# Patient Record
Sex: Male | Born: 1990 | Race: White | Hispanic: No | Marital: Single | Smoking: Former smoker
Health system: Southern US, Community
[De-identification: ages and names within clinical notes are randomized; demographics above are authoritative.]

## PROBLEM LIST (undated history)

## (undated) DIAGNOSIS — J45909 Unspecified asthma, uncomplicated: Secondary | ICD-10-CM

## (undated) HISTORY — DX: Unspecified asthma, uncomplicated: J45.909

## (undated) HISTORY — PX: WISDOM TOOTH EXTRACTION: SHX21

---

## 2017-03-28 ENCOUNTER — Other Ambulatory Visit: Payer: Self-pay | Admitting: Family Medicine

## 2017-03-28 DIAGNOSIS — R1031 Right lower quadrant pain: Secondary | ICD-10-CM

## 2017-03-29 ENCOUNTER — Other Ambulatory Visit: Payer: Self-pay | Admitting: Family Medicine

## 2017-03-29 ENCOUNTER — Ambulatory Visit
Admission: RE | Admit: 2017-03-29 | Discharge: 2017-03-29 | Disposition: A | Discharge status: Home / Self Care | Payer: BLUE CROSS/BLUE SHIELD | Source: Ambulatory Visit | Attending: Family Medicine | Admitting: Family Medicine

## 2017-03-29 DIAGNOSIS — R1031 Right lower quadrant pain: Secondary | ICD-10-CM

## 2017-03-29 DIAGNOSIS — R1030 Lower abdominal pain, unspecified: Secondary | ICD-10-CM | POA: Insufficient documentation

## 2017-03-31 ENCOUNTER — Other Ambulatory Visit: Payer: Self-pay | Admitting: Family Medicine

## 2017-03-31 ENCOUNTER — Telehealth: Payer: Self-pay | Admitting: *Deleted

## 2017-03-31 DIAGNOSIS — I8290 Acute embolism and thrombosis of unspecified vein: Secondary | ICD-10-CM

## 2017-03-31 NOTE — Telephone Encounter (Signed)
Incoming call from [facility] regarding coordination of NP apts. 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 Pt has another u/s sch. Of scrotum tomorrow at 230pm. I personally spoke with Dr. Georgann Housekeeper recommended pt to be seen in [facility] clinic on Tuesday afternoon.  Zella Ball made aware and will schedule this apt for the patient.

## 2017-04-01 ENCOUNTER — Ambulatory Visit
Admission: RE | Admit: 2017-04-01 | Discharge: 2017-04-01 | Disposition: A | Discharge status: Home / Self Care | Payer: BLUE CROSS/BLUE SHIELD | Source: Ambulatory Visit | Attending: Family Medicine | Admitting: Family Medicine

## 2017-04-01 DIAGNOSIS — I8290 Acute embolism and thrombosis of unspecified vein: Secondary | ICD-10-CM | POA: Diagnosis present

## 2017-04-01 DIAGNOSIS — N442 Benign cyst of testis: Secondary | ICD-10-CM | POA: Insufficient documentation

## 2017-04-01 DIAGNOSIS — N433 Hydrocele, unspecified: Secondary | ICD-10-CM | POA: Insufficient documentation

## 2017-04-01 DIAGNOSIS — I861 Scrotal varices: Secondary | ICD-10-CM | POA: Insufficient documentation

## 2017-04-05 ENCOUNTER — Encounter: Payer: Self-pay | Admitting: Internal Medicine

## 2017-04-05 ENCOUNTER — Other Ambulatory Visit: Payer: Self-pay

## 2017-04-05 ENCOUNTER — Inpatient Hospital Stay: Payer: BLUE CROSS/BLUE SHIELD | Attending: Internal Medicine | Admitting: Internal Medicine

## 2017-04-05 ENCOUNTER — Inpatient Hospital Stay: Payer: BLUE CROSS/BLUE SHIELD

## 2017-04-05 DIAGNOSIS — D6859 Other primary thrombophilia: Secondary | ICD-10-CM | POA: Insufficient documentation

## 2017-04-05 DIAGNOSIS — Z87891 Personal history of nicotine dependence: Secondary | ICD-10-CM | POA: Diagnosis not present

## 2017-04-05 DIAGNOSIS — Z7901 Long term (current) use of anticoagulants: Secondary | ICD-10-CM | POA: Insufficient documentation

## 2017-04-05 DIAGNOSIS — N433 Hydrocele, unspecified: Secondary | ICD-10-CM | POA: Insufficient documentation

## 2017-04-05 DIAGNOSIS — I749 Embolism and thrombosis of unspecified artery: Secondary | ICD-10-CM | POA: Diagnosis not present

## 2017-04-05 DIAGNOSIS — J45909 Unspecified asthma, uncomplicated: Secondary | ICD-10-CM | POA: Diagnosis not present

## 2017-04-05 DIAGNOSIS — I861 Scrotal varices: Secondary | ICD-10-CM | POA: Diagnosis not present

## 2017-04-05 DIAGNOSIS — L729 Follicular cyst of the skin and subcutaneous tissue, unspecified: Secondary | ICD-10-CM

## 2017-04-05 LAB — CBC WITH DIFFERENTIAL/PLATELET
BASOS ABS: 0.2 10*3/uL — AB (ref 0–0.1)
BASOS PCT: 2 %
EOS ABS: 0.2 10*3/uL (ref 0–0.7)
Eosinophils Relative: 3 %
HCT: 46.7 % (ref 40.0–52.0)
HEMOGLOBIN: 16.2 g/dL (ref 13.0–18.0)
Lymphocytes Relative: 27 %
Lymphs Abs: 1.8 10*3/uL (ref 1.0–3.6)
MCH: 29.5 pg (ref 26.0–34.0)
MCHC: 34.7 g/dL (ref 32.0–36.0)
MCV: 84.8 fL (ref 80.0–100.0)
MONOS PCT: 8 %
Monocytes Absolute: 0.5 10*3/uL (ref 0.2–1.0)
NEUTROS PCT: 60 %
Neutro Abs: 3.8 10*3/uL (ref 1.4–6.5)
Platelets: 248 10*3/uL (ref 150–440)
RBC: 5.51 MIL/uL (ref 4.40–5.90)
RDW: 12.7 % (ref 11.5–14.5)
WBC: 6.5 10*3/uL (ref 3.8–10.6)

## 2017-04-05 LAB — COMPREHENSIVE METABOLIC PANEL
ALBUMIN: 5.2 g/dL — AB (ref 3.5–5.0)
ALT: 12 U/L — AB (ref 17–63)
AST: 21 U/L (ref 15–41)
Alkaline Phosphatase: 61 U/L (ref 38–126)
Anion gap: 9 (ref 5–15)
BILIRUBIN TOTAL: 0.7 mg/dL (ref 0.3–1.2)
BUN: 13 mg/dL (ref 6–20)
CHLORIDE: 103 mmol/L (ref 101–111)
CO2: 27 mmol/L (ref 22–32)
CREATININE: 0.8 mg/dL (ref 0.61–1.24)
Calcium: 9.2 mg/dL (ref 8.9–10.3)
GFR calc Af Amer: >60 mL/min (ref >60)
GLUCOSE: 108 mg/dL — AB (ref 65–99)
Potassium: 3.8 mmol/L (ref 3.5–5.1)
Sodium: 139 mmol/L (ref 135–145)
TOTAL PROTEIN: 8.2 g/dL — AB (ref 6.5–8.1)

## 2017-04-05 NOTE — Progress Notes (Signed)
Patient is here today for a new patient visit.  

## 2017-04-05 NOTE — Progress Notes (Signed)
[facility] CONSULT NOTE  Patient Care Team: [place], [facility] as PCP - General  CHIEF COMPLAINTS/PURPOSE OF CONSULTATION: Blood clot.   #   $ October 11th 2018- Xarelto 15 BID.   No history exists.     HISTORY OF PRESENTING ILLNESS:  Ryan Ross 26 y.o.  male with no significant medical problems has been referred to [place] for further evaluation of "a clot"- in the right inguinal region.  Patient states that he initially noted to have pain in the right groin area approximately 1 week ago. This was evaluated further by an ultrasound- suggestive of superficial thrombophlebitis/clot. Given the symptomatology- patient has been started on Xarelto. Patient is currently on 15 twice a day. Since starting the Xarelto patient feels much improved.  He denies any prior history of blood clots. No family history of blood clots. Denies any history of smoking. He is very active. No long car rides or immobility. He works full-time as a Arts administrator. Patient denies any weight loss. Denies any nausea vomiting of blood loss.  ROS: A complete 10 point review of system is done which is negative except mentioned above in history of present illness  MEDICAL HISTORY:  Past Medical History:  Diagnosis Date  . Asthma     SURGICAL HISTORY: No past surgical history on file.  SOCIAL HISTORY:  Social History  . Marital status: Single    Spouse name: N/A  . Number of children: N/A  . Years of education: N/A   Occupational History  . Not on file.   Social History Main Topics  . Smoking status: Former Games developer  . Smokeless tobacco: Current User  . Alcohol use Yes     Comment: "social"  . Drug use: No  . Sexual activity: Not on file   Other Topics Concern  . Not on file   Social History Narrative  . No narrative on file    FAMILY HISTORY: No family history on file.  ALLERGIES:  has No Known Allergies.  MEDICATIONS:  Current Outpatient Prescriptions   Medication Sig Dispense Refill  . Rivaroxaban (XARELTO) 15 MG TABS tablet Take 15 mg by mouth 2 (two) times daily with a meal.     No current facility-administered medications for this visit.       Marland Kitchen  PHYSICAL EXAMINATION: ECOG PERFORMANCE STATUS: 0 - Asymptomatic  Vitals:   04/05/17 1345  BP: (!) 148/94  Pulse: 93  Resp: 14   There were no vitals filed for this visit.  GENERAL: Well-nourished well-developed; Alert, no distress and comfortable.   With family.  EYES: no pallor or icterus OROPHARYNX: no thrush or ulceration; good dentition  NECK: supple, no masses felt LYMPH:  no palpable lymphadenopathy in the cervical, axillary or inguinal regions LUNGS: clear to auscultation and  No wheeze or crackles HEART/CVS: regular rate & rhythm and no murmurs; No lower extremity edema ABDOMEN: abdomen soft, non-tender and normal bowel sounds Musculoskeletal:no cyanosis of digits and no clubbing  PSYCH: alert & oriented x 3 with fluent speech NEURO: no focal motor/sensory deficits SKIN:  no rashes or significant lesions  LABORATORY DATA:  I have reviewed the data as listed Lab Results  Component Value Date   WBC 6.5 04/05/2017   HGB 16.2 04/05/2017   HCT 46.7 04/05/2017   MCV 84.8 04/05/2017   PLT 248 04/05/2017    Recent Labs  04/05/17 1420  NA 139  K 3.8  CL 103  CO2 27  GLUCOSE 108*  BUN 13  CREATININE 0.80  CALCIUM 9.2  GFRNONAA >60  GFRAA >60  PROT 8.2*  ALBUMIN 5.2*  AST 21  ALT 12*  ALKPHOS 61  BILITOT 0.7    RADIOGRAPHIC STUDIES: I have personally reviewed the radiological images as listed and agreed with the findings in the report. [place] Rt Lower Extrem Ltd Soft Tissue Non Vascular  Result Date: 03/30/2017 CLINICAL DATA:  Right inguinal pain and "Rope like area" for 4 days. EXAM: ULTRASOUND LIMITED SOFT TISSUE TECHNIQUE: Ultrasound examination of the lower extremity soft tissues was performed in the area of clinical concern. COMPARISON:  None.  FINDINGS: Focused ultrasound exam was performed in the area of patient concern which is in the right inguinal region. The sonographer identifies a tubular hypoechoic structure with sonographic features compatible with thrombosed vessel. No hernia sac could be identified in the right groin region. IMPRESSION: 1. Ultrasound features most suggestive of vascular thrombosis in the right inguinal region, the area of patient concern. This is likely related to superficial venous thrombosis. Given proximity to the inguinal canal, gonadal venous thrombosis would be a consideration and scrotal ultrasound may prove helpful to assess for varicocele thrombosis although this typically presents with scrotal pain . Electronically Signed   By: Kennith Center M.D.   On: 03/30/2017 14:34   [place] Scrotom W/doppler  Result Date: 04/01/2017 CLINICAL DATA:  Evaluate for varicocele thrombosis. EXAM: SCROTAL ULTRASOUND DOPPLER ULTRASOUND OF THE TESTICLES TECHNIQUE: Complete ultrasound examination of the testicles, epididymis, and other scrotal structures was performed. Color and spectral Doppler ultrasound were also utilized to evaluate blood flow to the testicles. COMPARISON:  Ultrasound 03/29/2017. FINDINGS: Right testicle Measurements: 4.2 x 2.0 x 2.5 cm. No mass or microlithiasis visualized. Left testicle Measurements: 3.8 x 2.0 x 2.6 cm. No microlithiasis visualized.2 mm septated small cyst noted. Follow-up exam suggested demonstrate stability. Right epididymis:  Normal in size and appearance. Left epididymis:  Normal in size and appearance. Hydrocele:  Small right hydrocele. Varicocele: Bilateral varicoceles. No definite varicocele thrombosis noted. Pulsed Doppler interrogation of both testes demonstrates normal low resistance arterial and venous waveforms bilaterally. IMPRESSION: 1. Bilateral varicoceles.  No definite varicocele thrombosis noted . 2. Tiny 2 mm septated cyst left testicle. Follow-up exam in 3 months suggested to  demonstrate stability . 3. Small right hydrocele . Electronically Signed   By: Maisie Fus  Register   On: 04/01/2017 15:15   IMPRESSION: 1. Ultrasound features most suggestive of vascular thrombosis in the right inguinal region, the area of patient concern. This is likely related to superficial venous thrombosis. Given proximity to the inguinal canal, gonadal venous thrombosis would be a consideration and scrotal ultrasound may prove helpful to assess for varicocele thrombosis although this typically presents with scrotal pain .   Electronically Signed   By: Kennith Center M.D.   On: 03/30/2017 14:34  ASSESSMENT & PLAN:   Primary hypercoagulable state (HCC) # Right inguinal superficial blood clot/thrombophlebitis. Scrotal ultrasound negative for any gonadal vein involvement. The etiology of superficial blood clot is unclear- given the symptomatology however it is reasonable to continue Xarelto for 3-4 weeks. Recommend hypercoagulable workup- prothrombin gene mutation factor V Leiden and anti-phospholipid antibodies workup.   # Incidental left scrotal 3 mm cyst- recommend follow-up in 3 months.  # The above plan of care was discussed the patient and family in detail. Patient follow-up with me in approximately 10 days or so to review of her workup/next plan of care.  Thank you Dr.Rabinowitz for allowing me to participate  in the care of your pleasant patient. Please do not hesitate to contact me with questions or concerns in the interim.  All questions were answered. The patient knows to call the clinic with any problems, questions or concerns.    Earna Coder, MD 04/07/2017 4:31 PM

## 2017-04-07 LAB — CARDIOLIPIN ANTIBODIES, IGG, IGM, IGA: Anticardiolipin IgA: <9 APL U/mL (ref 0–11)

## 2017-04-07 LAB — BETA-2-GLYCOPROTEIN I ABS, IGG/M/A: Beta-2 Glyco I IgG: <9 GPI IgG units (ref 0–20)

## 2017-04-07 NOTE — Assessment & Plan Note (Signed)
#   Right inguinal superficial blood clot/thrombophlebitis. Scrotal ultrasound negative for any gonadal vein involvement. The etiology of superficial blood clot is unclear- given the symptomatology however it is reasonable to continue Xarelto for 3-4 weeks. Recommend hypercoagulable workup- prothrombin gene mutation factor V Leiden and anti-phospholipid antibodies workup.   # Incidental left scrotal 3 mm cyst- recommend follow-up in 3 months.  # The above plan of care was discussed the patient and family in detail. Patient follow-up with me in approximately 10 days or so to review of her workup/next plan of care.  Thank you Dr.Rabinowitz for allowing me to participate in the care of your pleasant patient. Please do not hesitate to contact me with questions or concerns in the interim.

## 2017-04-11 LAB — PROTHROMBIN GENE MUTATION

## 2017-04-11 LAB — FACTOR 5 LEIDEN

## 2017-04-22 ENCOUNTER — Inpatient Hospital Stay: Payer: BLUE CROSS/BLUE SHIELD | Attending: Internal Medicine | Admitting: Internal Medicine

## 2017-04-22 VITALS — BP 146/71 | HR 88 | Temp 98.1°F | Resp 16 | Wt 134.2 lb

## 2017-04-22 DIAGNOSIS — J45909 Unspecified asthma, uncomplicated: Secondary | ICD-10-CM | POA: Insufficient documentation

## 2017-04-22 DIAGNOSIS — I82890 Acute embolism and thrombosis of other specified veins: Secondary | ICD-10-CM | POA: Insufficient documentation

## 2017-04-22 DIAGNOSIS — D6859 Other primary thrombophilia: Secondary | ICD-10-CM

## 2017-04-22 DIAGNOSIS — Z87891 Personal history of nicotine dependence: Secondary | ICD-10-CM | POA: Insufficient documentation

## 2017-04-22 DIAGNOSIS — Z7901 Long term (current) use of anticoagulants: Secondary | ICD-10-CM | POA: Insufficient documentation

## 2017-04-22 DIAGNOSIS — L729 Follicular cyst of the skin and subcutaneous tissue, unspecified: Secondary | ICD-10-CM | POA: Diagnosis not present

## 2017-04-22 NOTE — Assessment & Plan Note (Addendum)
#   Right inguinal superficial blood clot/thrombophlebitis- symptomatic- resolved s/p 3 weeks of xarelto. Recommend stopping xarelto at this time. Hypercoagulable workup- prothrombin gene mutation factor V Leiden and anti-phospholipid antibodies-NEGATIVE.   # Incidental left scrotal 3 mm cyst- recommend follow-up in 3 months US as per PCP.   # follow up with us as needed.   Cc; Dr.Rabinowitz

## 2017-04-22 NOTE — Progress Notes (Signed)
[facility] OFFICE PROGRESS NOTE  Patient Care Team: [place], [facility] as PCP - General  Cancer Staging No matching staging information was found for the patient.    No history exists.   #  October 11th 2018- Xarelto 15 BID     INTERVAL HISTORY:  Ryan Ross 26 y.o.  male pleasant patient above history of superficial venous thrombosis of the right inguinal region symptomatic currently on Xarelto is here for follow-up. Patient finished his 3 weeks of Xarelto this morning.  Patient admits to no new Clots. Denies any bleeding complications. No weight loss. No nausea no vomiting. He feels otherwise normal.  REVIEW OF SYSTEMS:  A complete 10 point review of system is done which is negative except mentioned above/history of present illness.   PAST MEDICAL HISTORY :  Past Medical History:  Diagnosis Date  . Asthma     PAST SURGICAL HISTORY :  No past surgical history on file.  FAMILY HISTORY :  No family history on file.  SOCIAL HISTORY:   Social History  Substance Use Topics  . Smoking status: Former Games developer  . Smokeless tobacco: Current User  . Alcohol use Yes     Comment: "social"    ALLERGIES:  has No Known Allergies.  MEDICATIONS:  Current Outpatient Prescriptions  Medication Sig Dispense Refill  . Rivaroxaban (XARELTO) 15 MG TABS tablet Take 15 mg by mouth 2 (two) times daily with a meal.     No current facility-administered medications for this visit.     PHYSICAL EXAMINATION: ECOG PERFORMANCE STATUS: 0 - Asymptomatic  BP (!) 146/71 (BP Location: Left Arm, Patient Position: Sitting)   Pulse 88   Temp 98.1 F (36.7 C) (Tympanic)   Resp 16   Wt 134 lb 3.2 oz (60.9 kg)   Filed Weights   04/22/17 0926  Weight: 134 lb 3.2 oz (60.9 kg)    GENERAL: Well-nourished well-developed; Alert, no distress and comfortable.   With family.  EYES: no pallor or icterus OROPHARYNX: no thrush or ulceration; good dentition  NECK: supple, no  masses felt LYMPH:  no palpable lymphadenopathy in the cervical, axillary or inguinal regions LUNGS: clear to auscultation and  No wheeze or crackles HEART/CVS: regular rate & rhythm and no murmurs; No lower extremity edema ABDOMEN:abdomen soft, non-tender and normal bowel sounds Musculoskeletal:no cyanosis of digits and no clubbing  PSYCH: alert & oriented x 3 with fluent speech NEURO: no focal motor/sensory deficits SKIN:  no rashes or significant lesions  LABORATORY DATA:  I have reviewed the data as listed    Component Value Date/Time   NA 139 04/05/2017 1420   K 3.8 04/05/2017 1420   CL 103 04/05/2017 1420   CO2 27 04/05/2017 1420   GLUCOSE 108 (H) 04/05/2017 1420   BUN 13 04/05/2017 1420   CREATININE 0.80 04/05/2017 1420   CALCIUM 9.2 04/05/2017 1420   PROT 8.2 (H) 04/05/2017 1420   ALBUMIN 5.2 (H) 04/05/2017 1420   AST 21 04/05/2017 1420   ALT 12 (L) 04/05/2017 1420   ALKPHOS 61 04/05/2017 1420   BILITOT 0.7 04/05/2017 1420   GFRNONAA >60 04/05/2017 1420   GFRAA >60 04/05/2017 1420    No results found for: SPEP, UPEP  Lab Results  Component Value Date   WBC 6.5 04/05/2017   NEUTROABS 3.8 04/05/2017   HGB 16.2 04/05/2017   HCT 46.7 04/05/2017   MCV 84.8 04/05/2017   PLT 248 04/05/2017      Chemistry  Component Value Date/Time   NA 139 04/05/2017 1420   K 3.8 04/05/2017 1420   CL 103 04/05/2017 1420   CO2 27 04/05/2017 1420   BUN 13 04/05/2017 1420   CREATININE 0.80 04/05/2017 1420      Component Value Date/Time   CALCIUM 9.2 04/05/2017 1420   ALKPHOS 61 04/05/2017 1420   AST 21 04/05/2017 1420   ALT 12 (L) 04/05/2017 1420   BILITOT 0.7 04/05/2017 1420       RADIOGRAPHIC STUDIES: I have personally reviewed the radiological images as listed and agreed with the findings in the report. No results found.   ASSESSMENT & PLAN:  Primary hypercoagulable state (HCC) # Right inguinal superficial blood clot/thrombophlebitis- symptomatic- resolved  s/p 3 weeks of xarelto. Recommend stopping xarelto at this time. Hypercoagulable workup- prothrombin gene mutation factor V Leiden and anti-phospholipid antibodies-NEGATIVE.   # Incidental left scrotal 3 mm cyst- recommend follow-up in 3 months US as per PCP.   # follow up with us as needed.   Cc; Dr.Rabinowitz     No orders of the defined types were placed in this encounter.  All questions were answered. The patient knows to call the clinic with any problems, questions or concerns.      Earna CoderGovinda R Brahmanday, MD 04/22/2017 3:25 PM

## 2017-06-29 ENCOUNTER — Other Ambulatory Visit: Payer: Self-pay | Admitting: Family Medicine

## 2017-06-29 DIAGNOSIS — N442 Benign cyst of testis: Secondary | ICD-10-CM

## 2017-07-21 ENCOUNTER — Ambulatory Visit
Admission: RE | Admit: 2017-07-21 | Discharge: 2017-07-21 | Disposition: A | Discharge status: Home / Self Care | Payer: BLUE CROSS/BLUE SHIELD | Source: Ambulatory Visit | Attending: Family Medicine | Admitting: Family Medicine

## 2017-07-21 DIAGNOSIS — N442 Benign cyst of testis: Secondary | ICD-10-CM | POA: Diagnosis present

## 2017-07-21 DIAGNOSIS — I861 Scrotal varices: Secondary | ICD-10-CM | POA: Diagnosis not present

## 2018-02-20 IMAGING — US US EXTREM LOW*R* LIMITED
1 series · 14 of 25 positions shown · non-contrast
Comparison: None.

CLINICAL DATA: Right inguinal pain and "Rope like area" for 4 days.

EXAM:
ULTRASOUND LIMITED SOFT TISSUE
TECHNIQUE: Ultrasound examination of the lower extremity soft tissues was
performed in the area of clinical concern.

[Series 1: us extrem low*right* limited · 0.07mm/px · 14 of 25 slices shown]
[im 1/25]
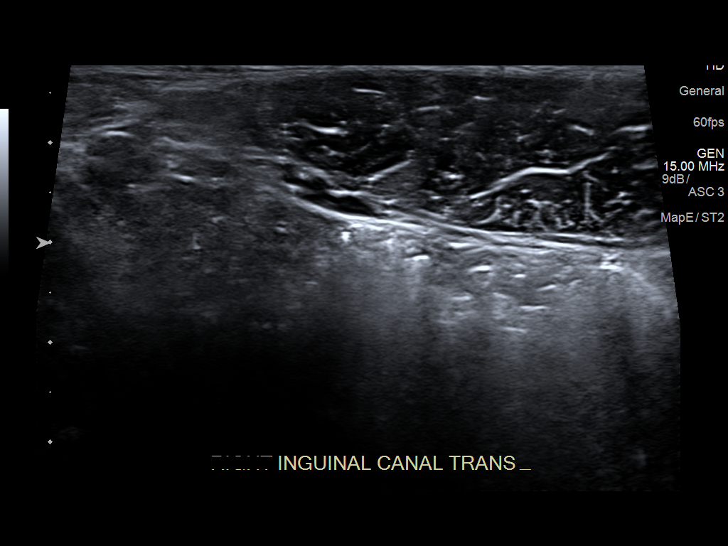
[im 3/25]
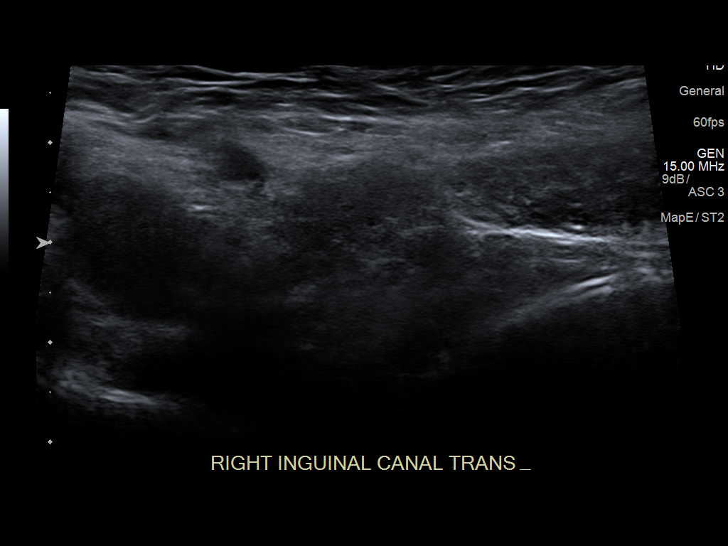
[im 5/25]
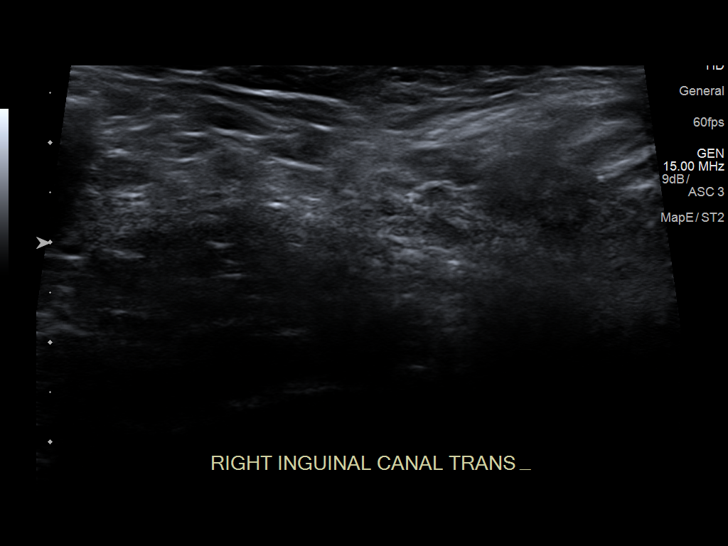
[im 7/25]
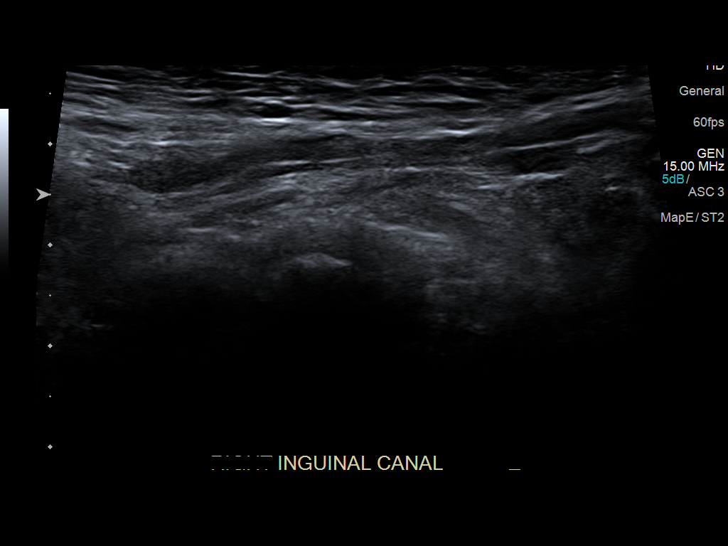
[im 9/25]
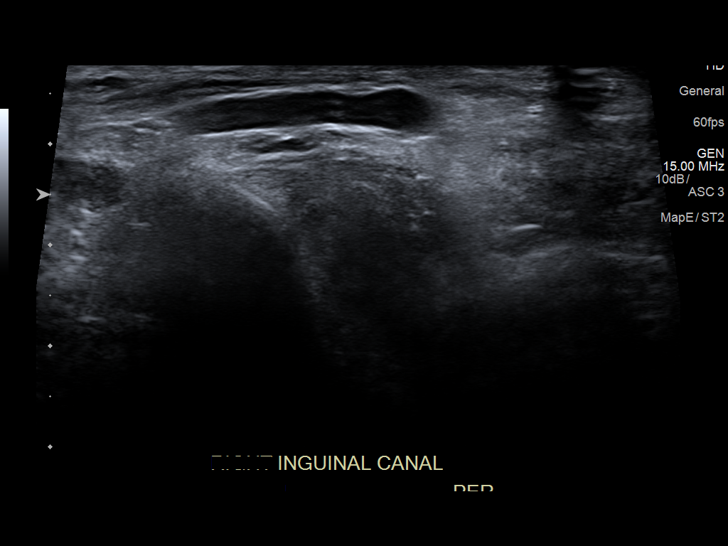
[im 10/25]
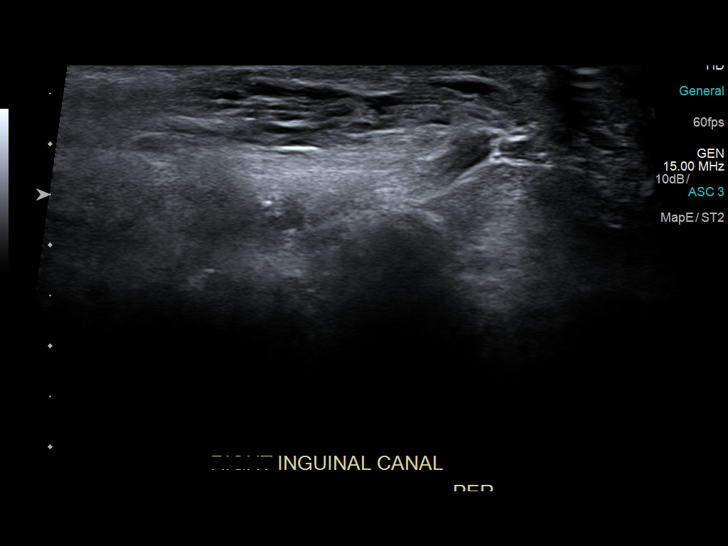
[im 12/25]
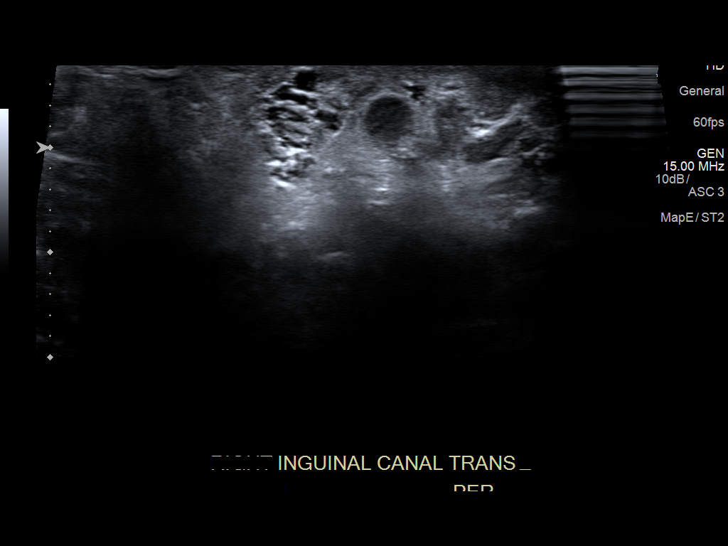
[im 14/25]
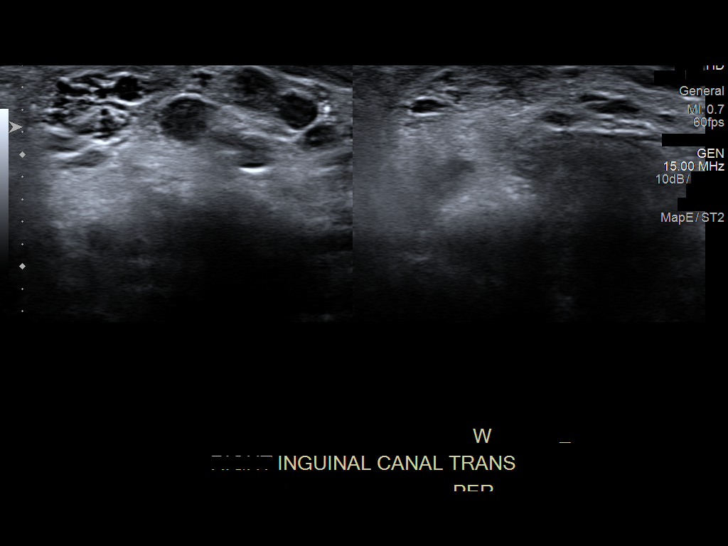
[im 16/25]
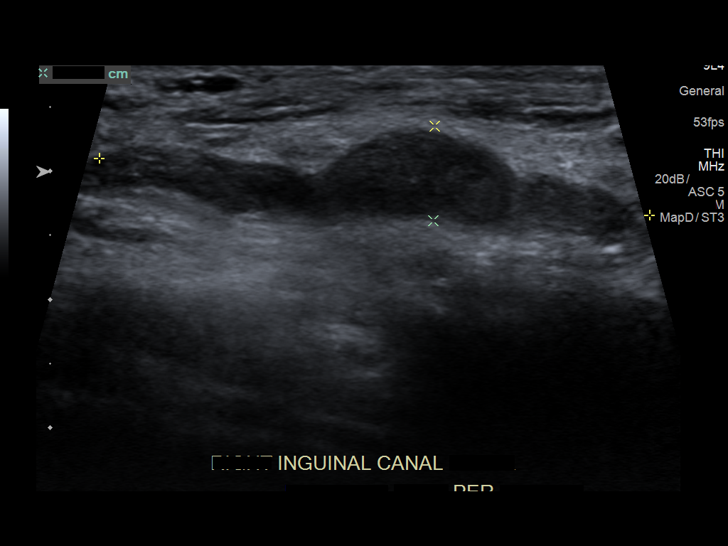
[im 17/25]
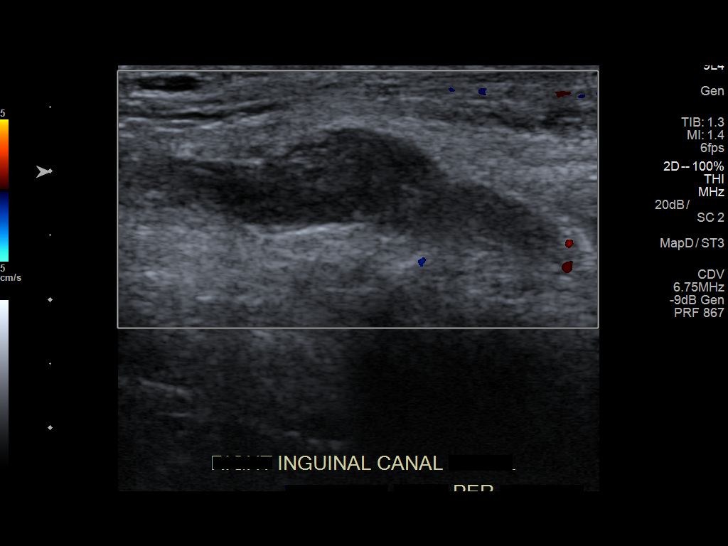
[im 19/25]
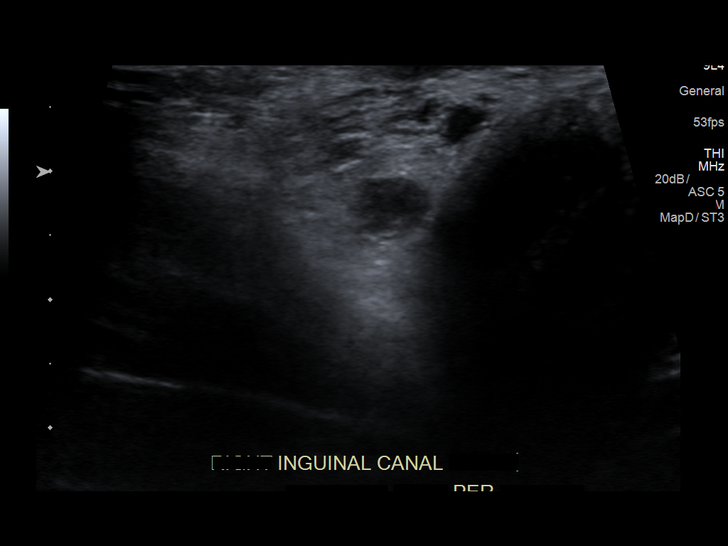
[im 21/25]
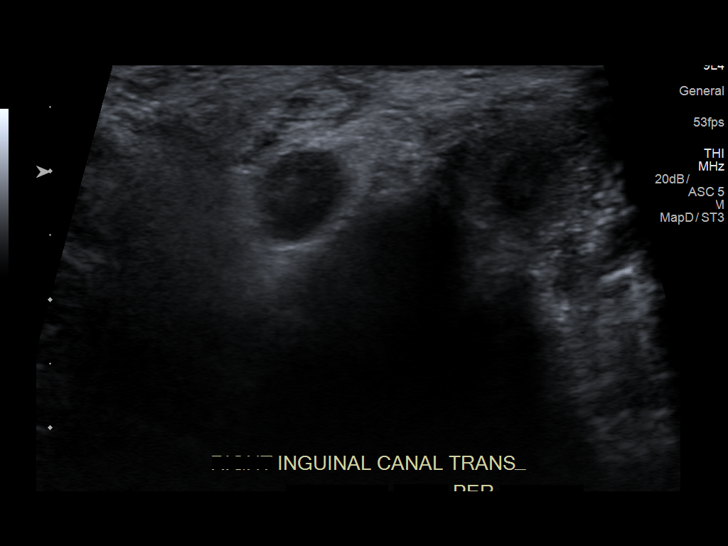
[im 23/25]
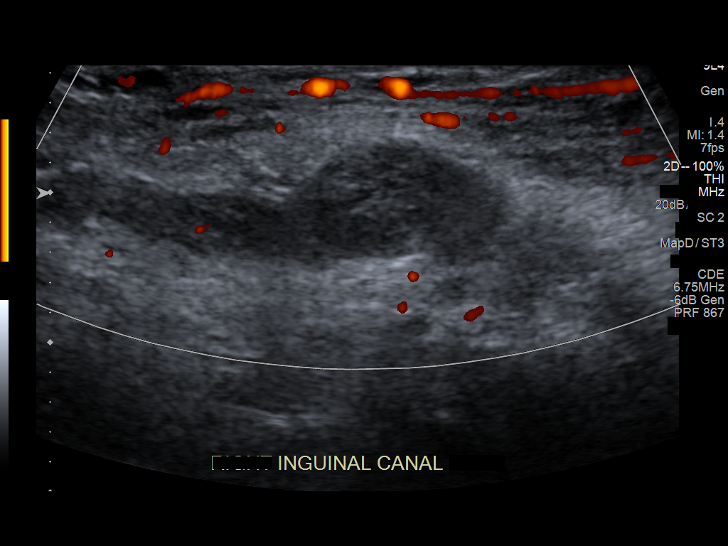
[im 25/25]
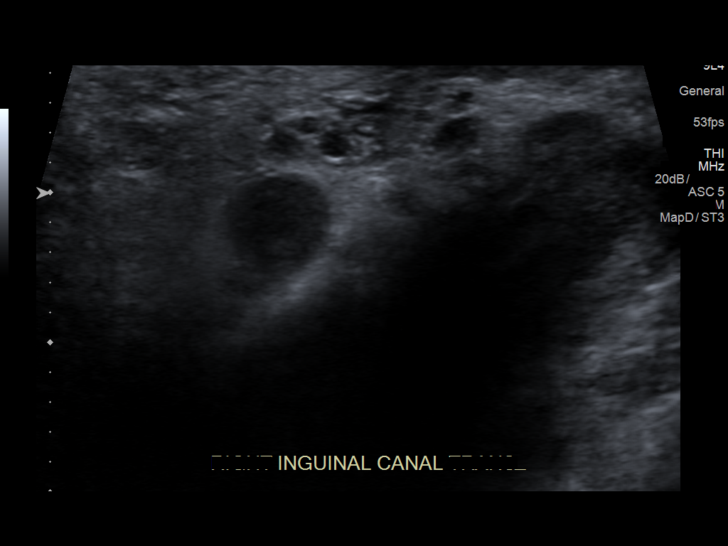

[14 of 25 positions shown; findings below may reference images not displayed]

FINDINGS: Focused ultrasound exam was performed in the area of patient concern
which is in the right inguinal region. The sonographer identifies a
tubular hypoechoic structure with sonographic features compatible
with thrombosed vessel.

No hernia sac could be identified in the right groin region.
IMPRESSION: 1. Ultrasound features most suggestive of vascular thrombosis in the
right inguinal region, the area of patient concern. This is likely
related to superficial venous thrombosis. Given proximity to the
inguinal canal, gonadal venous thrombosis would be a consideration
and scrotal ultrasound may prove helpful to assess for varicocele
thrombosis although this typically presents with scrotal pain .

## 2018-02-23 IMAGING — US US SCROTUM W/ DOPPLER COMPLETE
1 series · 13 of 25 positions shown · non-contrast
Comparison: Ultrasound 03/29/2017.

CLINICAL DATA: Evaluate for varicocele thrombosis.

EXAM:
SCROTAL ULTRASOUND
DOPPLER ULTRASOUND OF THE TESTICLES
TECHNIQUE: Complete ultrasound examination of the testicles, epididymis, and
other scrotal structures was performed. Color and spectral Doppler
ultrasound were also utilized to evaluate blood flow to the
testicles.

[Series 1: us scrotum w/ doppler complete · 0.07mm/px · 13 of 49 slices shown]
[im 1/49]
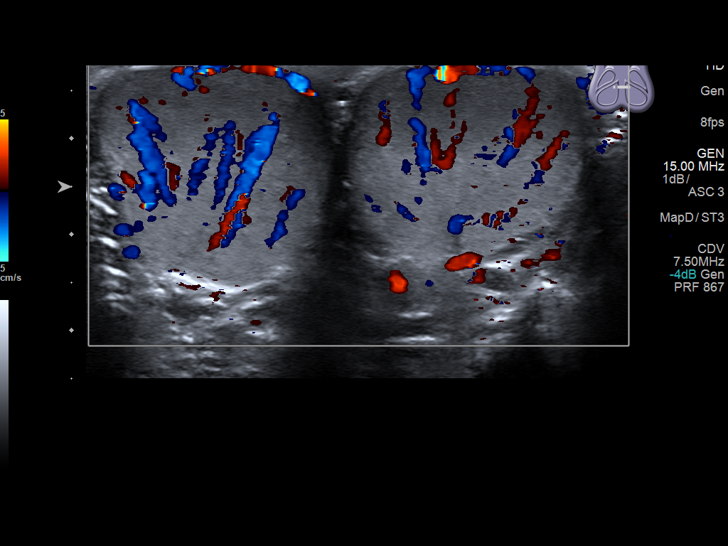
[im 5/49]
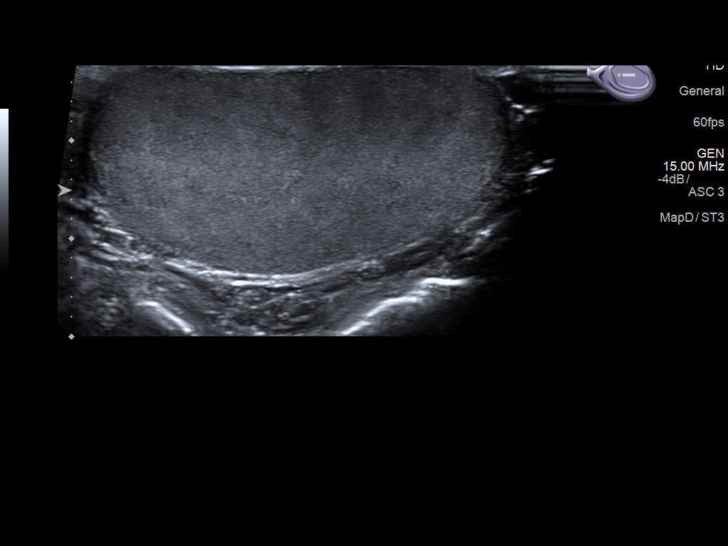
[im 9/49]
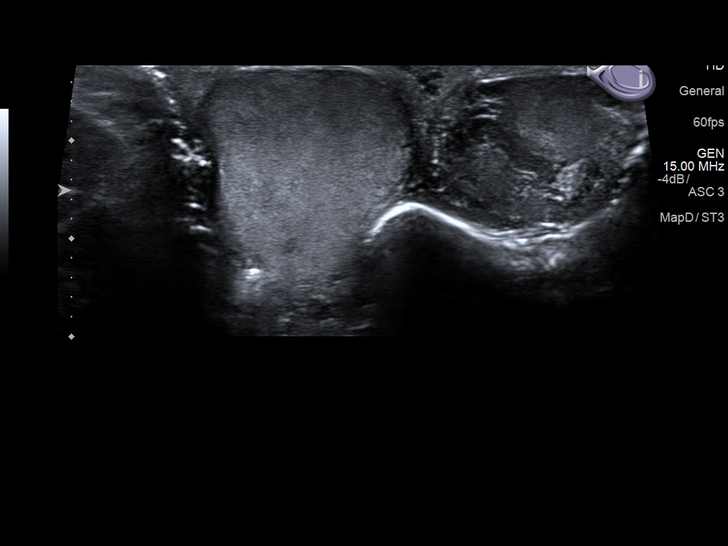
[im 13/49]
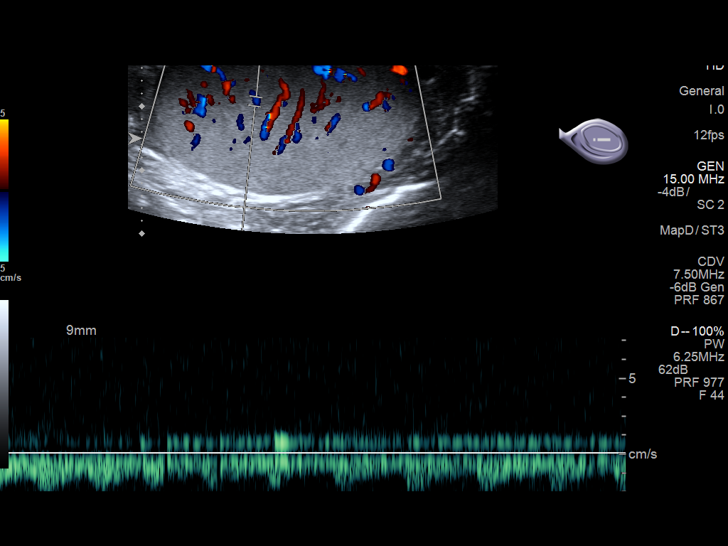
[im 17/49]
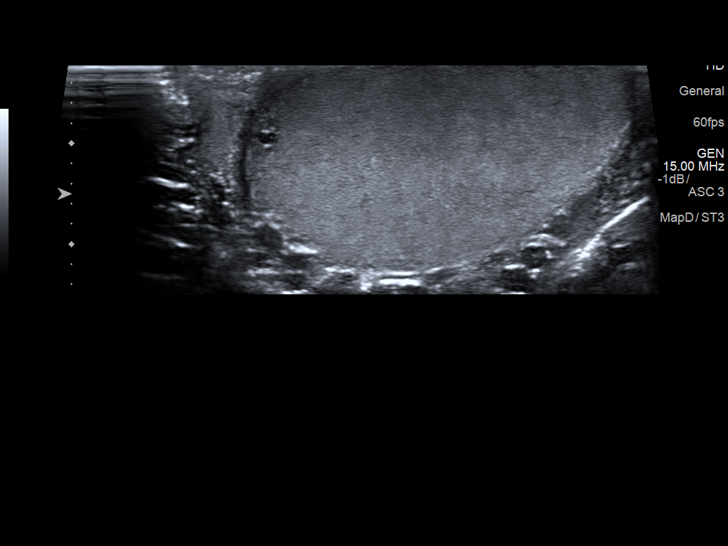
[im 21/49]
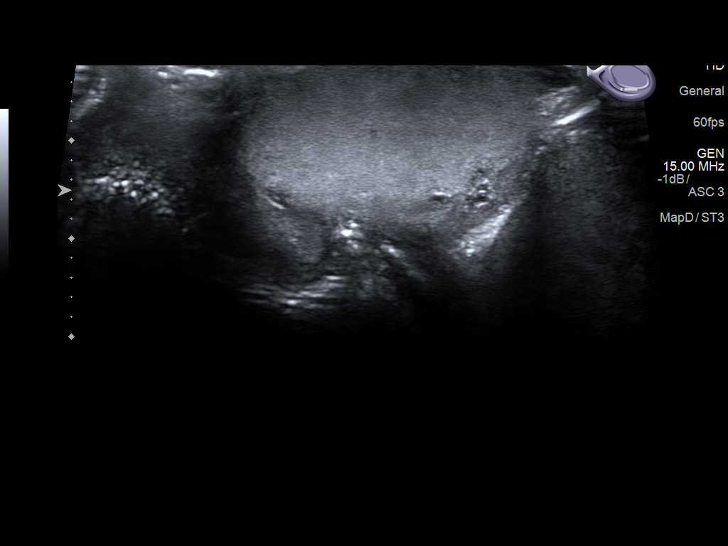
[im 25/49]
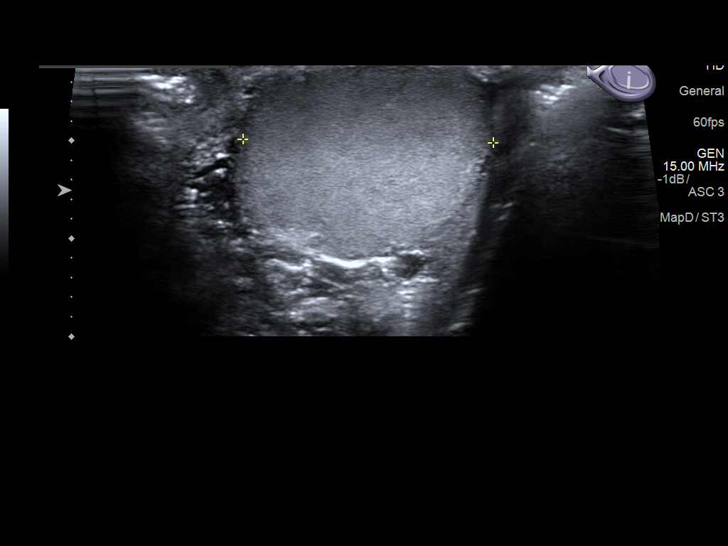
[im 29/49]
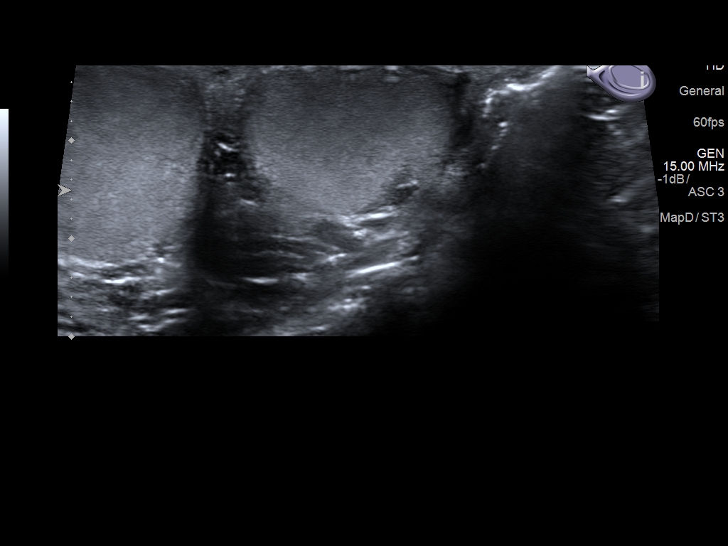
[im 33/49]
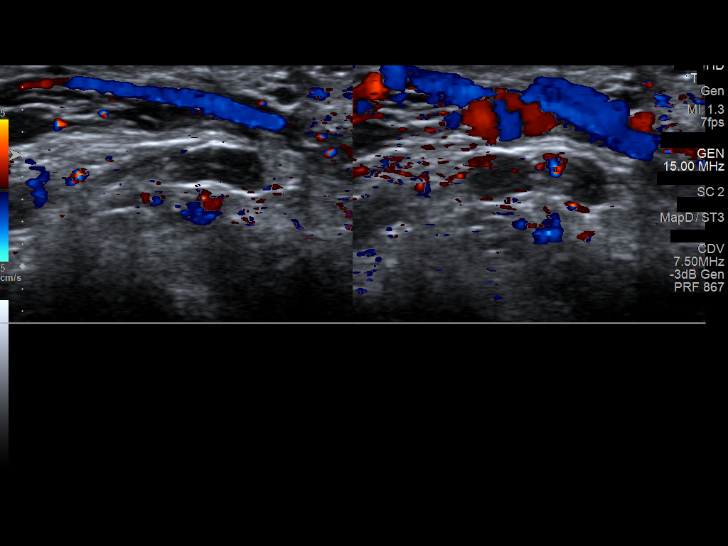
[im 37/49]
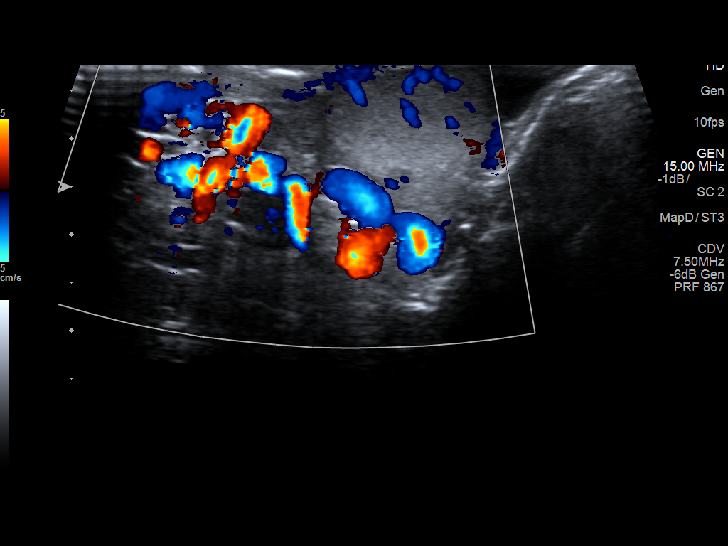
[im 41/49]
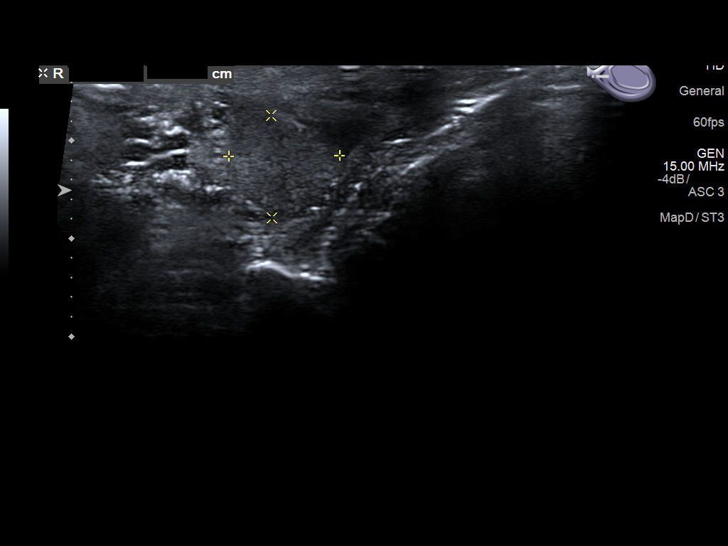
[im 45/49]
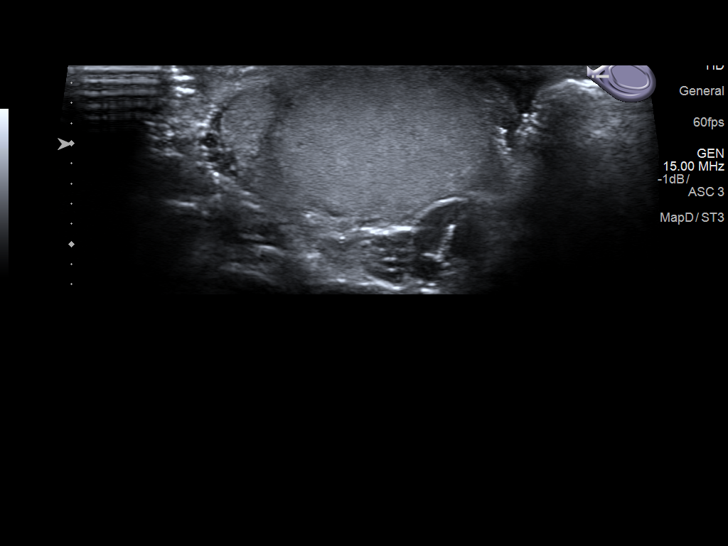
[im 49/49]
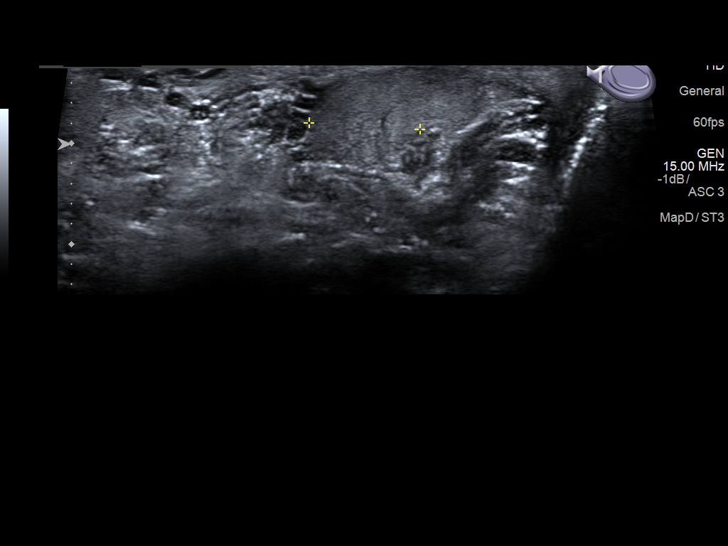

[13 of 25 positions shown; findings below may reference images not displayed]

FINDINGS: Right testicle

Measurements: 4.2 x 2.0 x 2.5 cm. No mass or microlithiasis
visualized.

Left testicle

Measurements: 3.8 x 2.0 x 2.6 cm. No microlithiasis visualized.2 mm
septated small cyst noted. Follow-up exam suggested demonstrate
stability.

Right epididymis:  Normal in size and appearance.

Left epididymis:  Normal in size and appearance.

Hydrocele:  Small right hydrocele.

Varicocele: Bilateral varicoceles. No definite varicocele thrombosis
noted.

Pulsed Doppler interrogation of both testes demonstrates normal low
resistance arterial and venous waveforms bilaterally.
IMPRESSION: 1. Bilateral varicoceles.  No definite varicocele thrombosis noted .

2. Tiny 2 mm septated cyst left testicle. Follow-up exam in 3 months
suggested to demonstrate stability .

3. Small right hydrocele .

## 2018-06-14 IMAGING — US US SCROTUM W/ DOPPLER COMPLETE
1 series · 14 of 25 positions shown · non-contrast
Comparison: 04/01/2017

CLINICAL DATA: Follow-up testicular cyst

EXAM:
SCROTAL ULTRASOUND
DOPPLER ULTRASOUND OF THE TESTICLES
TECHNIQUE: Complete ultrasound examination of the testicles, epididymis, and
other scrotal structures was performed. Color and spectral Doppler
ultrasound were also utilized to evaluate blood flow to the
testicles.

[Series 1: us scrotum w/ doppler complete · 0.07mm/px · 14 of 50 slices shown]
[im 1/50]
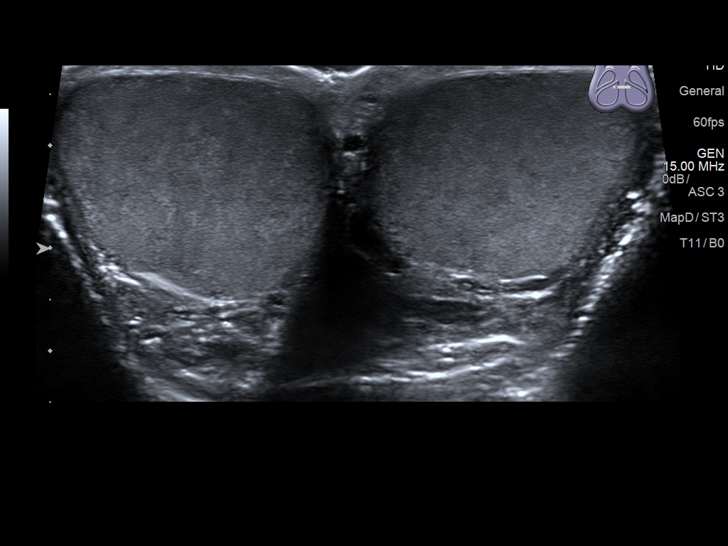
[im 5/50]
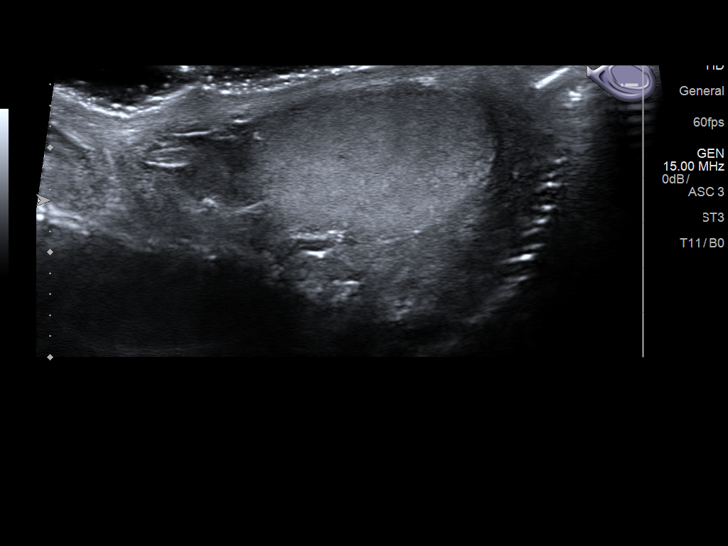
[im 9/50]
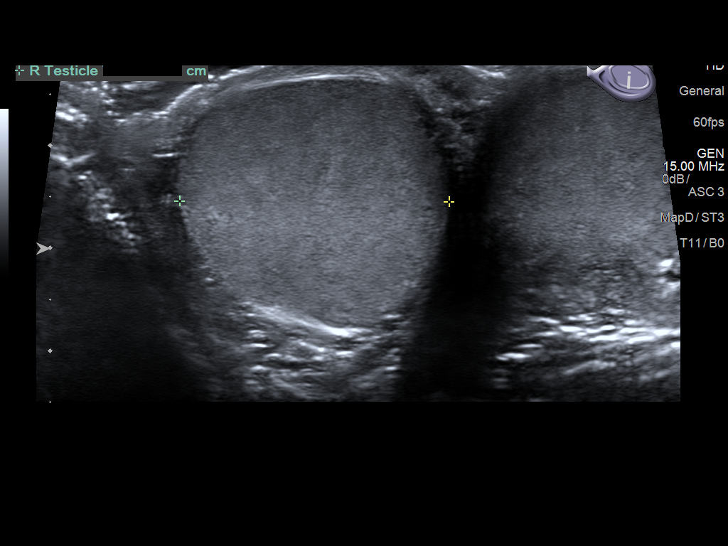
[im 13/50]
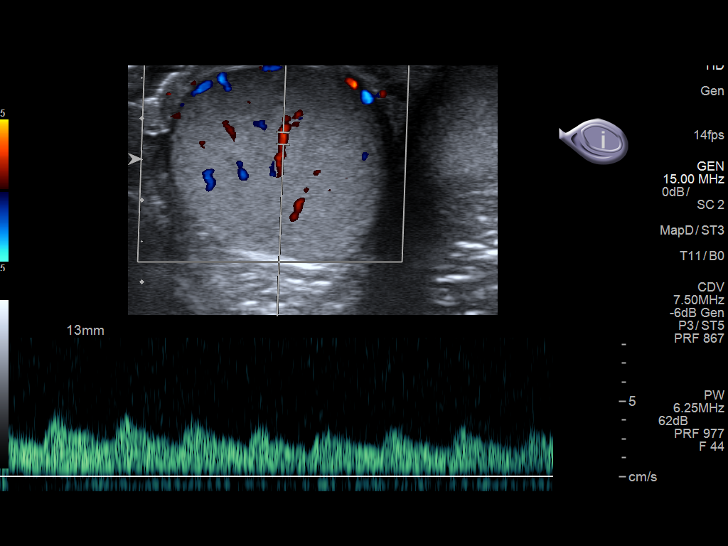
[im 17/50]
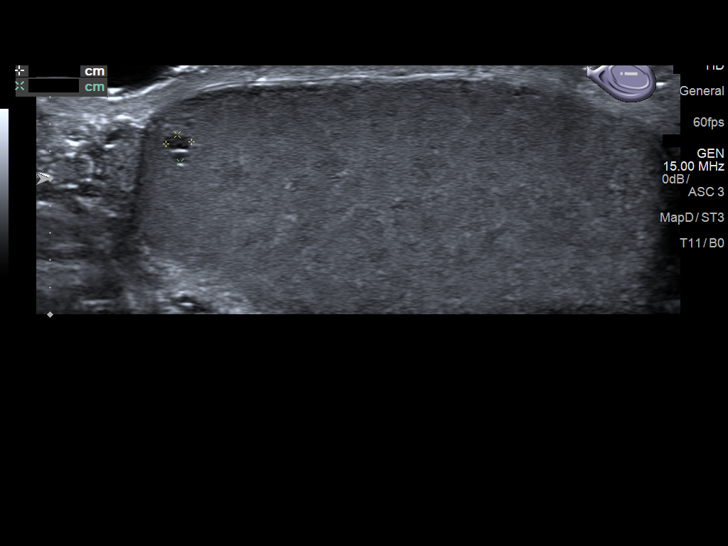
[im 19/50]
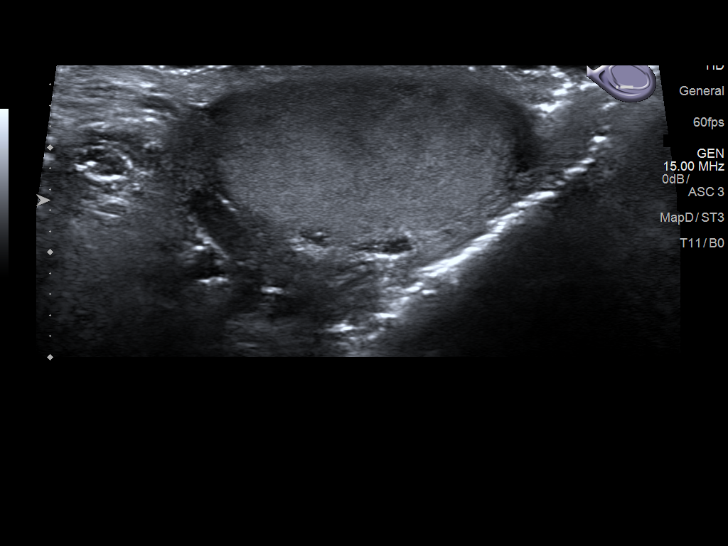
[im 23/50]
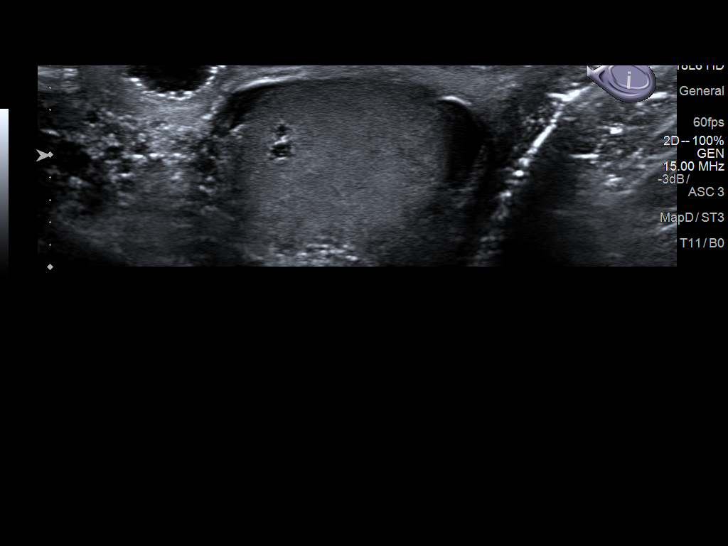
[im 27/50]
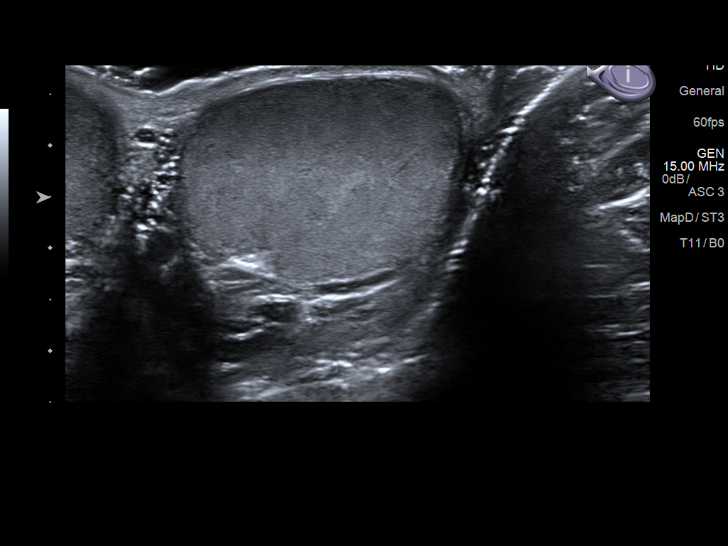
[im 31/50]
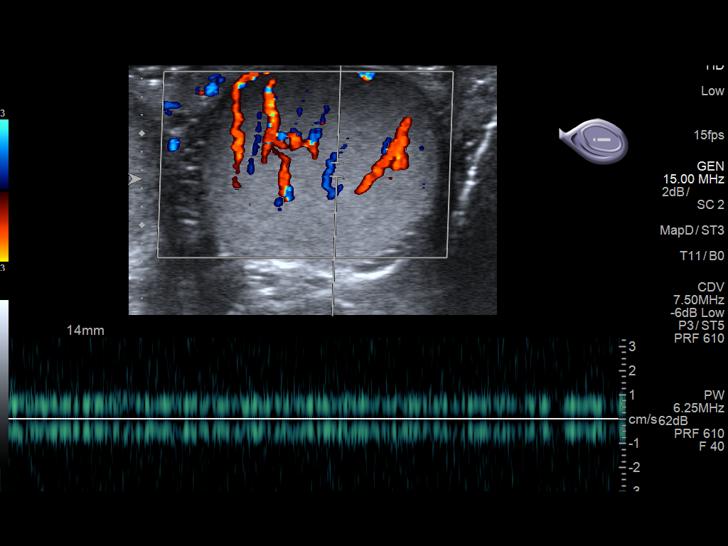
[im 33/50]
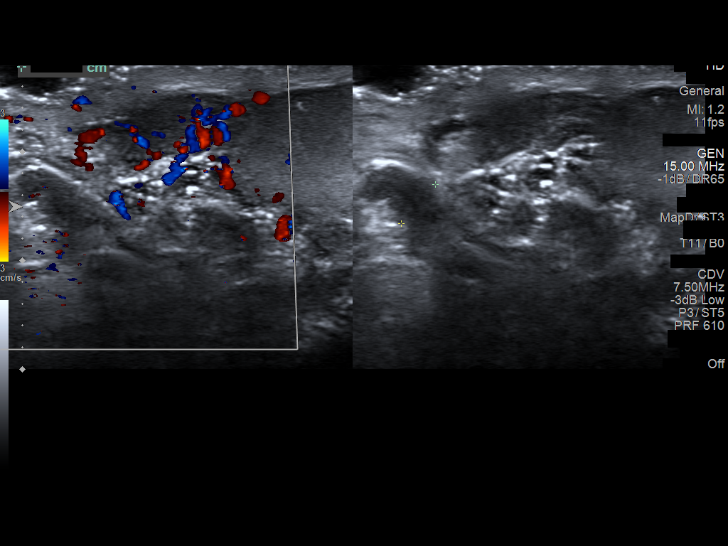
[im 37/50]
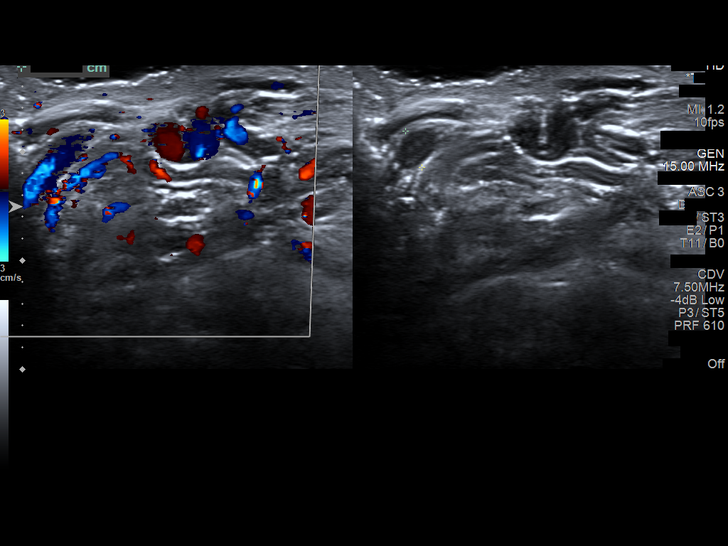
[im 41/50]
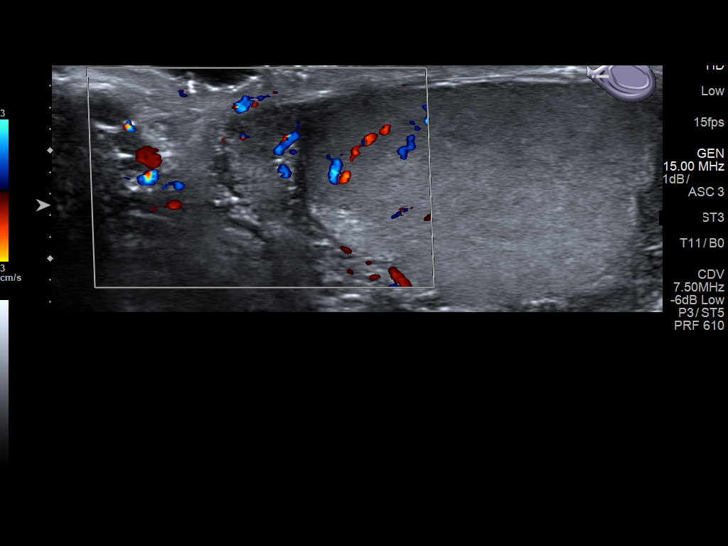
[im 45/50]
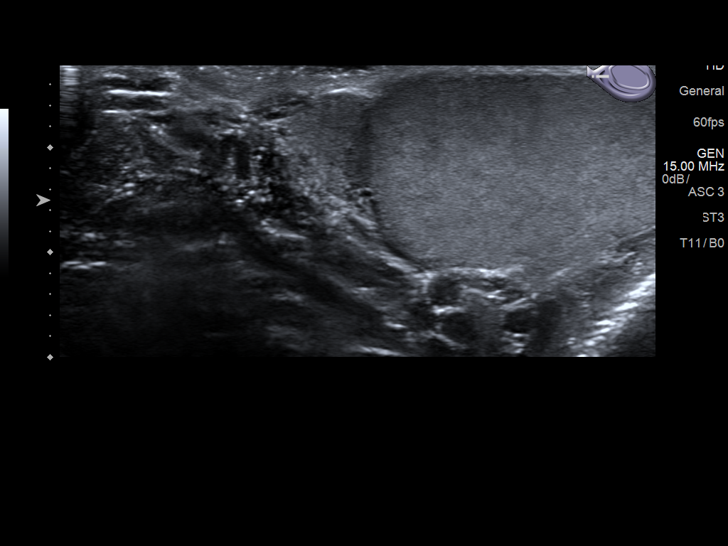
[im 50/50]
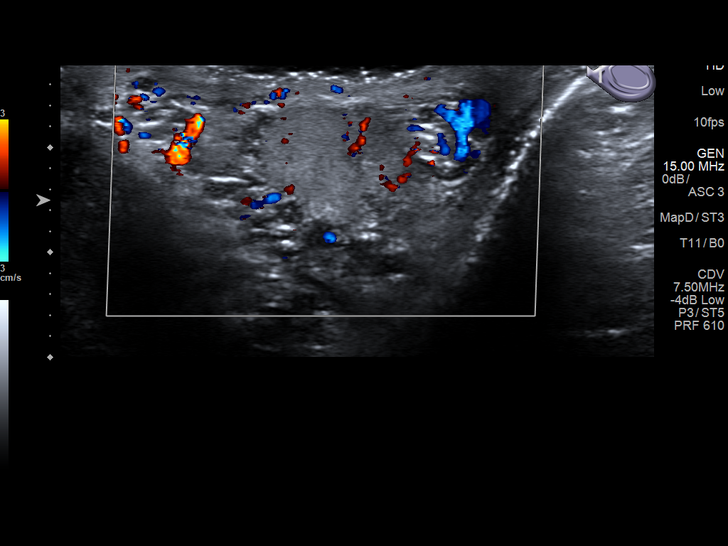

[14 of 25 positions shown; findings below may reference images not displayed]

FINDINGS: Right testicle

Measurements: 4.0 x 2.4 x 2.6 cm.. No mass or microlithiasis
visualized.

Left testicle

Measurements: 3.8 x 2.2 x 2.5 cm.. Small septated cyst is again
identified and stable. It measures approximately 2 mm in greatest
dimension.

Right epididymis:  Normal in size and appearance.

Left epididymis:  Normal in size and appearance.

Hydrocele:  None visualized.

Varicocele: Bilateral varicoceles are noted slightly more prominent
on the left than the right.

Pulsed Doppler interrogation of both testes demonstrates normal low
resistance arterial and venous waveforms bilaterally.
IMPRESSION: Stable left testicular cyst.

Bilateral varicoceles stable from the previous exam.

## 2019-01-08 ENCOUNTER — Telehealth: Payer: Self-pay

## 2019-01-08 NOTE — Telephone Encounter (Signed)
Called stating his wife works at the hospital & she was notified by the hospital that she had a low risk COVID exposure (another employee that works on her floor tested positive), but the hospital didn't recommend testing & is allowing her to continue working.  She is asymptomatic.  Ryan Ross is aymptomatic.  Advised him to monitor for S/Sx, check temp, continue social distancing & wearing  mask.  If he should develop any symptoms to contact us back.  AMD

## 2019-06-11 ENCOUNTER — Ambulatory Visit: Payer: Self-pay | Admitting: Occupational Medicine

## 2019-06-11 ENCOUNTER — Other Ambulatory Visit: Payer: Self-pay

## 2019-06-11 ENCOUNTER — Encounter: Payer: Self-pay | Admitting: Occupational Medicine

## 2019-06-11 VITALS — BP 142/84 | HR 93 | Temp 99.1°F | Resp 12 | Ht 67.0 in | Wt 139.0 lb

## 2019-06-11 DIAGNOSIS — J302 Other seasonal allergic rhinitis: Secondary | ICD-10-CM | POA: Insufficient documentation

## 2019-06-11 DIAGNOSIS — Z0000 Encounter for general adult medical examination without abnormal findings: Secondary | ICD-10-CM

## 2019-06-11 DIAGNOSIS — F418 Other specified anxiety disorders: Secondary | ICD-10-CM | POA: Insufficient documentation

## 2019-06-11 LAB — POCT URINALYSIS DIPSTICK
Bilirubin, UA: NEGATIVE
Blood, UA: POSITIVE
Glucose, UA: NEGATIVE
Ketones, UA: NEGATIVE
Leukocytes, UA: NEGATIVE
Nitrite, UA: NEGATIVE
Protein, UA: NEGATIVE
Spec Grav, UA: 1.02 (ref 1.010–1.025)
Urobilinogen, UA: 0.2 E.U./dL
pH, UA: 6 (ref 5.0–8.0)

## 2019-06-11 NOTE — Progress Notes (Signed)
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  Labs drawn today, results pending.  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  Binocular vision 20/22. Right monocular distance vision 20/100. Patient meets NFPA standards (6.4.1; would be disqualifying to have monocular vision, defined as far visual acuity worse than 20/100 in the worse eye). Patient does not meet CDL/DOT driver qualifications. Advised patient schedule appointment with optometrist for corrective lenses.  Darlin Priestly, MHS, PA-C 06/11/2019

## 2019-06-12 LAB — CMP12+LP+TP+TSH+6AC+PSA+CBC?
ALT: 8 IU/L (ref 0–44)
Alkaline Phosphatase: 66 IU/L (ref 39–117)
BUN: 16 mg/dL (ref 6–20)
Basophils Absolute: 0.1 10*3/uL (ref 0.0–0.2)
Basos: 1 %
Calcium: 9.9 mg/dL (ref 8.7–10.2)
Chloride: 103 mmol/L (ref 96–106)
Chol/HDL Ratio: 3 ratio (ref 0.0–5.0)
Cholesterol, Total: 162 mg/dL (ref 100–199)
Creatinine, Ser: 0.93 mg/dL (ref 0.76–1.27)
Eos: 3 %
Estimated CHD Risk: <0.5 times avg. (ref 0.0–1.0)
Hematocrit: 49 % (ref 37.5–51.0)
Immature Grans (Abs): 0 10*3/uL (ref 0.0–0.1)
LDL Chol Calc (NIH): 88 mg/dL (ref 0–99)
Lymphocytes Absolute: 1.9 10*3/uL (ref 0.7–3.1)
Monocytes Absolute: 0.5 10*3/uL (ref 0.1–0.9)
Neutrophils Absolute: 4.2 10*3/uL (ref 1.4–7.0)
Neutrophils: 61 %
TSH: 1.77 u[IU]/mL (ref 0.450–4.500)
Uric Acid: 5.4 mg/dL (ref 3.8–8.4)
VLDL Cholesterol Cal: 20 mg/dL (ref 5–40)

## 2019-06-12 LAB — CMP12+LP+TP+TSH+6AC+PSA+CBC…
AST: 20 IU/L (ref 0–40)
Albumin/Globulin Ratio: 2.4 — ABNORMAL HIGH (ref 1.2–2.2)
Albumin: 5.2 g/dL (ref 4.1–5.2)
BUN/Creatinine Ratio: 17 (ref 9–20)
Bilirubin Total: 0.3 mg/dL (ref 0.0–1.2)
EOS (ABSOLUTE): 0.2 10*3/uL (ref 0.0–0.4)
Free Thyroxine Index: 1.8 (ref 1.2–4.9)
GFR calc Af Amer: 131 mL/min/{1.73_m2} (ref >59)
GFR calc non Af Amer: 113 mL/min/{1.73_m2} (ref >59)
GGT: 16 IU/L (ref 0–65)
Globulin, Total: 2.2 g/dL (ref 1.5–4.5)
HDL: 54 mg/dL (ref >39)
Hemoglobin: 17 g/dL (ref 13.0–17.7)
Immature Granulocytes: 0 %
Iron: 79 ug/dL (ref 38–169)
LDH: 301 IU/L — ABNORMAL HIGH (ref 121–224)
Lymphs: 27 %
MCH: 29.5 pg (ref 26.6–33.0)
MCHC: 34.7 g/dL (ref 31.5–35.7)
MCV: 85 fL (ref 79–97)
Monocytes: 8 %
Platelets: 296 10*3/uL (ref 150–450)
Prostate Specific Ag, Serum: 0.9 ng/mL (ref 0.0–4.0)
RBC: 5.76 x10E6/uL (ref 4.14–5.80)
RDW: 12.2 % (ref 11.6–15.4)
Sodium: 141 mmol/L (ref 134–144)
T3 Uptake Ratio: 28 % (ref 24–39)
T4, Total: 6.3 ug/dL (ref 4.5–12.0)
Total Protein: 7.4 g/dL (ref 6.0–8.5)
Triglycerides: 114 mg/dL (ref 0–149)
WBC: 6.8 10*3/uL (ref 3.4–10.8)

## 2020-04-29 ENCOUNTER — Ambulatory Visit: Payer: Self-pay

## 2020-04-29 ENCOUNTER — Other Ambulatory Visit: Payer: Self-pay

## 2020-04-29 DIAGNOSIS — Z01818 Encounter for other preprocedural examination: Secondary | ICD-10-CM

## 2020-04-29 LAB — POCT URINALYSIS DIPSTICK
Bilirubin, UA: NEGATIVE
Blood, UA: NEGATIVE
Glucose, UA: NEGATIVE
Ketones, UA: NEGATIVE
Leukocytes, UA: NEGATIVE
Nitrite, UA: NEGATIVE
Protein, UA: NEGATIVE
Spec Grav, UA: 1.025 (ref 1.010–1.025)
Urobilinogen, UA: 0.2 U/dL
pH, UA: 6 (ref 5.0–8.0)

## 2020-04-29 NOTE — Progress Notes (Signed)
Pt scheduled to complete physical with Caryl Asp 05/05/20

## 2020-04-30 LAB — CMP12+LP+TP+TSH+6AC+CBC/D/PLT
ALT: 11 IU/L (ref 0–44)
AST: 21 IU/L (ref 0–40)
Albumin/Globulin Ratio: 2 (ref 1.2–2.2)
Albumin: 4.9 g/dL (ref 4.1–5.2)
Alkaline Phosphatase: 57 IU/L (ref 44–121)
BUN/Creatinine Ratio: 19 (ref 9–20)
BUN: 18 mg/dL (ref 6–20)
Basophils Absolute: 0.1 10*3/uL (ref 0.0–0.2)
Basos: 1 %
Bilirubin Total: 0.6 mg/dL (ref 0.0–1.2)
Calcium: 9.5 mg/dL (ref 8.7–10.2)
Chloride: 103 mmol/L (ref 96–106)
Chol/HDL Ratio: 2.9 ratio (ref 0.0–5.0)
Cholesterol, Total: 163 mg/dL (ref 100–199)
Creatinine, Ser: 0.97 mg/dL (ref 0.76–1.27)
EOS (ABSOLUTE): 0.2 10*3/uL (ref 0.0–0.4)
Eos: 4 %
Estimated CHD Risk: <0.5 times avg. (ref 0.0–1.0)
Free Thyroxine Index: 1.8 (ref 1.2–4.9)
GFR calc Af Amer: 124 mL/min/{1.73_m2} (ref >59)
GFR calc non Af Amer: 107 mL/min/{1.73_m2} (ref >59)
GGT: 14 IU/L (ref 0–65)
Globulin, Total: 2.5 g/dL (ref 1.5–4.5)
Glucose: 95 mg/dL (ref 65–99)
HDL: 56 mg/dL (ref >39)
Hematocrit: 47.2 % (ref 37.5–51.0)
Hemoglobin: 16.4 g/dL (ref 13.0–17.7)
Immature Grans (Abs): 0 10*3/uL (ref 0.0–0.1)
Immature Granulocytes: 0 %
Iron: 97 ug/dL (ref 38–169)
LDH: 163 IU/L (ref 121–224)
LDL Chol Calc (NIH): 95 mg/dL (ref 0–99)
Lymphocytes Absolute: 1.7 10*3/uL (ref 0.7–3.1)
Lymphs: 27 %
MCH: 29.6 pg (ref 26.6–33.0)
MCHC: 34.7 g/dL (ref 31.5–35.7)
MCV: 85 fL (ref 79–97)
Monocytes Absolute: 0.4 10*3/uL (ref 0.1–0.9)
Monocytes: 7 %
Neutrophils Absolute: 3.7 10*3/uL (ref 1.4–7.0)
Neutrophils: 61 %
Phosphorus: 3.8 mg/dL (ref 2.8–4.1)
Platelets: 264 10*3/uL (ref 150–450)
Potassium: 4.1 mmol/L (ref 3.5–5.2)
RBC: 5.54 x10E6/uL (ref 4.14–5.80)
RDW: 12.1 % (ref 11.6–15.4)
Sodium: 141 mmol/L (ref 134–144)
T3 Uptake Ratio: 30 % (ref 24–39)
T4, Total: 6.1 ug/dL (ref 4.5–12.0)
TSH: 1.1 u[IU]/mL (ref 0.450–4.500)
Total Protein: 7.4 g/dL (ref 6.0–8.5)
Triglycerides: 61 mg/dL (ref 0–149)
Uric Acid: 5.8 mg/dL (ref 3.8–8.4)
VLDL Cholesterol Cal: 12 mg/dL (ref 5–40)
WBC: 6.1 10*3/uL (ref 3.4–10.8)

## 2020-05-01 LAB — QUANTIFERON-TB GOLD PLUS
QuantiFERON Mitogen Value: >10 IU/mL
QuantiFERON Nil Value: 0.12 IU/mL
QuantiFERON TB1 Ag Value: 0.11 IU/mL
QuantiFERON TB2 Ag Value: 0.16 IU/mL
QuantiFERON-TB Gold Plus: NEGATIVE

## 2020-05-05 ENCOUNTER — Other Ambulatory Visit: Payer: Self-pay

## 2020-05-05 ENCOUNTER — Encounter: Payer: Self-pay | Admitting: Nurse Practitioner

## 2020-05-05 ENCOUNTER — Ambulatory Visit: Payer: Self-pay | Admitting: Nurse Practitioner

## 2020-05-05 VITALS — BP 145/83 | HR 84 | Temp 99.1°F | Resp 12 | Ht 68.0 in | Wt 139.0 lb

## 2020-05-05 DIAGNOSIS — R9431 Abnormal electrocardiogram [ECG] [EKG]: Secondary | ICD-10-CM

## 2020-05-05 DIAGNOSIS — Z0000 Encounter for general adult medical examination without abnormal findings: Secondary | ICD-10-CM

## 2020-05-05 NOTE — Addendum Note (Signed)
Addended by: Viviano Simas E on: 05/05/2020 04:48 PM   Modules accepted: Orders

## 2020-05-05 NOTE — Progress Notes (Addendum)
Subjective:    Patient ID: Ryan Ross, male    DOB: 09-07-1990, 29 y.o.   MRN: [id number]  HPI  29 year old male presenting for annual FF exam.   No complaints today. Blood pressure stable from last year remains elevated at 145/83. Denies and cardiovascular symptoms. Does not exercise regularly. Feels he can run for 30 minutes without distress or SOB.   Visits dentist every 6 months. Has not had eye appointment Vision today: 20/25 both eyes  Right eye with continued 20/100 on exam stable from last year. Left 20/30  Denies tobacco use  Grew up on tobacco farm, notes he does eat a lot of unhealthy foods, eats out, fried foods even at home.   Asthma noted as a child, has not used an inhaler since age 31.  Maternal grandfather had an MI at 40 Father's medical background unknown.   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  RBBB noted on EKG 2020  Today's Vitals   05/05/20 1515  BP: (!) 145/83  Pulse: 84  Resp: 12  Temp: 99.1 F (37.3 C)  TempSrc: Oral  SpO2: 98%  Weight: 139 lb (63 kg)  Height: '5\' 8"'  (1.727 m)   Body mass index is 21.13 kg/m.  Past Medical History:  Diagnosis Date  . Asthma    Review of Systems  Constitutional: Negative.   HENT: Negative.   Eyes: Negative.   Respiratory: Negative.   Cardiovascular: Negative.   Gastrointestinal: Negative.   Endocrine: Negative.   Genitourinary: Negative.   Musculoskeletal: Negative.   Skin: Negative.   Allergic/Immunologic: Negative.   Neurological: Negative.   Hematological: Negative.   Psychiatric/Behavioral: Negative.        Objective:   Physical Exam Constitutional:      Appearance: Normal appearance.  HENT:     Head: Normocephalic.     Right Ear: Tympanic membrane, ear canal and external ear normal.     Left Ear: Tympanic membrane, ear canal and external ear normal.     Nose: Nose normal.     Mouth/Throat:     Mouth: Mucous membranes are moist.      Pharynx: Oropharynx is clear.  Eyes:     Extraocular Movements: Extraocular movements intact.     Conjunctiva/sclera: Conjunctivae normal.     Pupils: Pupils are equal, round, and reactive to light.  Cardiovascular:     Rate and Rhythm: Normal rate and regular rhythm.     Pulses: Normal pulses.     Heart sounds: Normal heart sounds.  Pulmonary:     Effort: Pulmonary effort is normal.     Breath sounds: Normal breath sounds.  Abdominal:     General: Abdomen is flat. Bowel sounds are normal.     Palpations: Abdomen is soft.  Musculoskeletal:        General: Normal range of motion.     Cervical back: Normal range of motion and neck supple.  Skin:    General: Skin is warm and dry.     Capillary Refill: Capillary refill takes less than 2 seconds.  Neurological:     General: No focal deficit present.     Mental Status: He is alert and oriented to person, place, and time.  Psychiatric:        Mood and Affect: Mood normal.        Behavior: Behavior normal.        Thought Content: Thought content normal.        Judgment:  Judgment normal.    Recent Results (from the past 2160 hour(s))  CMP12+LP+TP+TSH+6AC+CBC/D/Plt     Status: None   Collection Time: 04/29/20  9:32 AM  Result Value Ref Range   Glucose 95 65 - 99 mg/dL   Uric Acid 5.8 3.8 - 8.4 mg/dL    Comment:            Therapeutic target for gout patients: <6.0   BUN 18 6 - 20 mg/dL   Creatinine, Ser 0.97 0.76 - 1.27 mg/dL   GFR calc non Af Amer 107 >59 mL/min/1.73   GFR calc Af Amer 124 >59 mL/min/1.73    Comment: **In accordance with recommendations from the NKF-ASN Task force,**   [facility] is in the process of updating its eGFR calculation to the   2021 CKD-EPI creatinine equation that estimates kidney function   without a race variable.    BUN/Creatinine Ratio 19 9 - 20   Sodium 141 134 - 144 mmol/L   Potassium 4.1 3.5 - 5.2 mmol/L   Chloride 103 96 - 106 mmol/L   Calcium 9.5 8.7 - 10.2 mg/dL   Phosphorus 3.8 2.8 - 4.1  mg/dL   Total Protein 7.4 6.0 - 8.5 g/dL   Albumin 4.9 4.1 - 5.2 g/dL   Globulin, Total 2.5 1.5 - 4.5 g/dL   Albumin/Globulin Ratio 2.0 1.2 - 2.2   Bilirubin Total 0.6 0.0 - 1.2 mg/dL   Alkaline Phosphatase 57 44 - 121 IU/L    Comment:               **Please note reference interval change**   LDH 163 121 - 224 IU/L   AST 21 0 - 40 IU/L   ALT 11 0 - 44 IU/L   GGT 14 0 - 65 IU/L   Iron 97 38 - 169 ug/dL   Cholesterol, Total 163 100 - 199 mg/dL   Triglycerides 61 0 - 149 mg/dL   HDL 56 >39 mg/dL   VLDL Cholesterol Cal 12 5 - 40 mg/dL   LDL Chol Calc (NIH) 95 0 - 99 mg/dL   Chol/HDL Ratio 2.9 0.0 - 5.0 ratio    Comment:                                   T. Chol/HDL Ratio                                             Men  Women                               1/2 Avg.Risk  3.4    3.3                                   Avg.Risk  5.0    4.4                                2X Avg.Risk  9.6    7.1  3X Avg.Risk 23.4   11.0    Estimated CHD Risk  < 0.5 0.0 - 1.0 times avg.    Comment: The CHD Risk is based on the T. Chol/HDL ratio. Other factors affect CHD Risk such as hypertension, smoking, diabetes, severe obesity, and family history of premature CHD.    TSH 1.100 0.450 - 4.500 uIU/mL   T4, Total 6.1 4.5 - 12.0 ug/dL   T3 Uptake Ratio 30 24 - 39 %   Free Thyroxine Index 1.8 1.2 - 4.9   WBC 6.1 3.4 - 10.8 x10E3/uL   RBC 5.54 4.14 - 5.80 x10E6/uL   Hemoglobin 16.4 13.0 - 17.7 g/dL   Hematocrit 47.2 37.5 - 51.0 %   MCV 85 79 - 97 fL   MCH 29.6 26.6 - 33.0 pg   MCHC 34.7 31 - 35 g/dL   RDW 12.1 11.6 - 15.4 %   Platelets 264 150 - 450 x10E3/uL   Neutrophils 61 Not Estab. %   Lymphs 27 Not Estab. %   Monocytes 7 Not Estab. %   Eos 4 Not Estab. %   Basos 1 Not Estab. %   Neutrophils Absolute 3.7 1.40 - 7.00 x10E3/uL   Lymphocytes Absolute 1.7 0 - 3 x10E3/uL   Monocytes Absolute 0.4 0 - 0 x10E3/uL   EOS (ABSOLUTE) 0.2 0.0 - 0.4 x10E3/uL   Basophils  Absolute 0.1 0 - 0 x10E3/uL   Immature Granulocytes 0 Not Estab. %   Immature Grans (Abs) 0.0 0.0 - 0.1 x10E3/uL  QuantiFERON-TB Gold Plus     Status: None   Collection Time: 04/29/20  9:32 AM  Result Value Ref Range   QuantiFERON Incubation Incubation performed.    QuantiFERON Criteria Comment     Comment: The QuantiFERON-TB Gold Plus result is determined by subtracting the Nil value from either TB antigen (Ag) tube. The mitogen tube serves as a control for the test.    QuantiFERON TB1 Ag Value 0.11 IU/mL   QuantiFERON TB2 Ag Value 0.16 IU/mL   QuantiFERON Nil Value 0.12 IU/mL   QuantiFERON Mitogen Value >10.00 IU/mL   QuantiFERON-TB Gold Plus Negative Negative    Comment: Chemiluminescence immunoassay methodology  POCT urinalysis dipstick     Status: Normal   Collection Time: 04/29/20  9:58 AM  Result Value Ref Range   Color, UA yellow    Clarity, UA clear    Glucose, UA Negative Negative   Bilirubin, UA negative    Ketones, UA negative    Spec Grav, UA 1.025 1.010 - 1.025   Blood, UA negative    pH, UA 6.0 5.0 - 8.0   Protein, UA Negative Negative   Urobilinogen, UA 0.2 0.2 or 1.0 E.U./dL   Nitrite, UA negative    Leukocytes, UA Negative Negative   Appearance medium    Odor           Assessment & Plan:  After review of chart and found history of venous thrombosis 2018 and with RBBB noted first in 2020 will recommend Cardiology workup.   Discussed diet changes, decreasing salt/sodium. Will make a goal to alter diet over the next year.   Will monitor BP at home and work and record in phone.   Goal of 30 minutes of cardiovascular exercise 3-5 times a week. Encouraged to start by using treadmill at work and build up to [employer] for 30 minutes.   Discussed CV risk and that due to age and normal labs will hold off on blood pressure medication  at this point, would like <140/80 within the next year.   RTC if blood pressure monitoring at home goes above for BP  check in office or with any other concerns as discussed. His wife is a Marine scientist and he has access to BP machines at work and is agreeable to monitor.   Advised ophthalmology for eye exam due to right eye reading today.

## 2020-09-25 DIAGNOSIS — H5212 Myopia, left eye: Secondary | ICD-10-CM | POA: Diagnosis not present

## 2021-07-29 ENCOUNTER — Ambulatory Visit: Payer: Self-pay

## 2021-07-29 ENCOUNTER — Other Ambulatory Visit: Payer: Self-pay

## 2021-07-29 DIAGNOSIS — Z0000 Encounter for general adult medical examination without abnormal findings: Secondary | ICD-10-CM

## 2021-07-29 LAB — POCT URINALYSIS DIPSTICK
Bilirubin, UA: NEGATIVE
Blood, UA: NEGATIVE
Glucose, UA: NEGATIVE
Ketones, UA: NEGATIVE
Leukocytes, UA: NEGATIVE
Nitrite, UA: NEGATIVE
Protein, UA: NEGATIVE
Spec Grav, UA: 1.015 (ref 1.010–1.025)
Urobilinogen, UA: 0.2 E.U./dL
pH, UA: 7.5 (ref 5.0–8.0)

## 2021-07-29 NOTE — Progress Notes (Signed)
08/04/21 annual physical scheduled

## 2021-07-30 LAB — CMP12+LP+TP+TSH+6AC+CBC/D/PLT
ALT: 11 IU/L (ref 0–44)
AST: 14 IU/L (ref 0–40)
Albumin/Globulin Ratio: 2.1 (ref 1.2–2.2)
Albumin: 5.2 g/dL (ref 4.1–5.2)
Alkaline Phosphatase: 64 IU/L (ref 44–121)
BUN/Creatinine Ratio: 15 (ref 9–20)
BUN: 16 mg/dL (ref 6–20)
Basophils Absolute: 0.1 10*3/uL (ref 0.0–0.2)
Basos: 1 %
Bilirubin Total: 0.7 mg/dL (ref 0.0–1.2)
Calcium: 9.9 mg/dL (ref 8.7–10.2)
Chloride: 102 mmol/L (ref 96–106)
Chol/HDL Ratio: 3.5 ratio (ref 0.0–5.0)
Cholesterol, Total: 200 mg/dL — ABNORMAL HIGH (ref 100–199)
Creatinine, Ser: 1.04 mg/dL (ref 0.76–1.27)
EOS (ABSOLUTE): 0.5 10*3/uL — ABNORMAL HIGH (ref 0.0–0.4)
Eos: 7 %
Estimated CHD Risk: 0.6 times avg. (ref 0.0–1.0)
Free Thyroxine Index: 2 (ref 1.2–4.9)
GGT: 14 IU/L (ref 0–65)
Globulin, Total: 2.5 g/dL (ref 1.5–4.5)
Glucose: 99 mg/dL (ref 70–99)
HDL: 57 mg/dL (ref >39)
Hematocrit: 50.3 % (ref 37.5–51.0)
Hemoglobin: 16.9 g/dL (ref 13.0–17.7)
Immature Grans (Abs): 0.1 10*3/uL (ref 0.0–0.1)
Immature Granulocytes: 1 %
Iron: 147 ug/dL (ref 38–169)
LDH: 152 IU/L (ref 121–224)
LDL Chol Calc (NIH): 128 mg/dL — ABNORMAL HIGH (ref 0–99)
Lymphocytes Absolute: 2.4 10*3/uL (ref 0.7–3.1)
Lymphs: 33 %
MCH: 28.5 pg (ref 26.6–33.0)
MCHC: 33.6 g/dL (ref 31.5–35.7)
MCV: 85 fL (ref 79–97)
Monocytes Absolute: 0.5 10*3/uL (ref 0.1–0.9)
Monocytes: 7 %
Neutrophils Absolute: 3.6 10*3/uL (ref 1.4–7.0)
Neutrophils: 51 %
Phosphorus: 3.5 mg/dL (ref 2.8–4.1)
Platelets: 270 10*3/uL (ref 150–450)
Potassium: 4.1 mmol/L (ref 3.5–5.2)
RBC: 5.94 x10E6/uL — ABNORMAL HIGH (ref 4.14–5.80)
RDW: 12.2 % (ref 11.6–15.4)
Sodium: 141 mmol/L (ref 134–144)
T3 Uptake Ratio: 29 % (ref 24–39)
T4, Total: 6.9 ug/dL (ref 4.5–12.0)
TSH: 0.623 u[IU]/mL (ref 0.450–4.500)
Total Protein: 7.7 g/dL (ref 6.0–8.5)
Triglycerides: 85 mg/dL (ref 0–149)
Uric Acid: 5.4 mg/dL (ref 3.8–8.4)
VLDL Cholesterol Cal: 15 mg/dL (ref 5–40)
WBC: 7.1 10*3/uL (ref 3.4–10.8)
eGFR: 100 mL/min/{1.73_m2} (ref >59)

## 2021-08-04 ENCOUNTER — Ambulatory Visit: Payer: Self-pay

## 2021-08-04 ENCOUNTER — Ambulatory Visit: Payer: Self-pay | Admitting: Physician Assistant

## 2021-08-04 ENCOUNTER — Other Ambulatory Visit: Payer: Self-pay

## 2021-08-04 ENCOUNTER — Encounter: Payer: Self-pay | Admitting: Physician Assistant

## 2021-08-04 VITALS — BP 130/70 | HR 70 | Temp 97.5°F | Resp 12 | Ht 68.0 in | Wt 138.0 lb

## 2021-08-04 DIAGNOSIS — Z0000 Encounter for general adult medical examination without abnormal findings: Secondary | ICD-10-CM

## 2021-08-04 DIAGNOSIS — Z0289 Encounter for other administrative examinations: Secondary | ICD-10-CM

## 2021-08-04 DIAGNOSIS — I4510 Unspecified right bundle-branch block: Secondary | ICD-10-CM

## 2021-08-04 DIAGNOSIS — R0989 Other specified symptoms and signs involving the circulatory and respiratory systems: Secondary | ICD-10-CM

## 2021-08-04 NOTE — Progress Notes (Signed)
[facility]  ____________________________________________   None    (approximate)  I have reviewed the triage vital signs and the nursing notes.   HISTORY  Chief Complaint Employment Physical Pharmacologist Physical)    HPI Ryan Ross is a 31 y.o. male patient presents for annual physical.  Patient voices no concerns or complaints..  Exam shows patient has increasing right bundle branch block.  Primary hypercoagulable history. Patient also has a cataract of the left eye secondary to an injury as a teenager.      Past Medical History:  Diagnosis Date   Asthma     Patient Active Problem List   Diagnosis Date Noted   Seasonal allergies 06/11/2019   Situational anxiety 06/11/2019   Primary hypercoagulable state (HCC) 04/05/2017    Past Surgical History:  Procedure Laterality Date   WISDOM TOOTH EXTRACTION     3 removed    Prior to Admission medications   Medication Sig Start Date End Date Taking? Authorizing Provider  Rivaroxaban (XARELTO) 15 MG TABS tablet Take 15 mg by mouth 2 (two) times daily with a meal.    [provider]    Allergies Patient has no known allergies.  Family History  Problem Relation Age of Onset   Dementia Maternal Grandmother    Heart attack Maternal Grandfather     Social History Social History   Tobacco Use   Smoking status: Former   Smokeless tobacco: Current    Types: Snuff  Vaping Use   Vaping Use: Never used  Substance Use Topics   Alcohol use: Yes    Comment: "social"   Drug use: No    Review of Systems Constitutional: No fever/chills Eyes: No visual changes. ENT: No sore throat. Cardiovascular: Denies chest pain. Respiratory: Denies shortness of breath. Gastrointestinal: No abdominal pain.  No nausea, no vomiting.  No diarrhea.  No constipation. Genitourinary: Negative for dysuria. Musculoskeletal: Negative for back pain. Skin: Negative for rash. Neurological:  Negative for headaches, focal weakness or numbness. Psychiatric: Anxiety Endocrine:  Hematological/Lymphatic: History of hypercoagulable state. Allergic/Immunilogical: Seasonal allergies ____________________________________________   PHYSICAL EXAM:  VITAL SIGNS: Constitutional: Alert and oriented. Well appearing and in no acute distress. Eyes: Conjunctivae are normal. PERRL. EOMI. Head: Atraumatic. Nose: No congestion/rhinnorhea. Mouth/Throat: Mucous membranes are moist.  Oropharynx non-erythematous. Neck: No stridor.  No cervical spine tenderness to palpation. Hematological/Lymphatic/Immunilogical: No cervical lymphadenopathy. Cardiovascular: Normal rate, regular rhythm. Grossly normal heart sounds.  Good peripheral circulation.  Right Carotid carotid bruit. Respiratory: Normal respiratory effort.  No retractions. Lungs CTAB. Gastrointestinal: Soft and nontender. No distention. No abdominal bruits. No CVA tenderness. Genitourinary: deferred Musculoskeletal: No lower extremity tenderness nor edema.  No joint effusions. Neurologic:  Normal speech and language. No gross focal neurologic deficits are appreciated. No gait instability. Skin:  Skin is warm, dry and intact. No rash noted. Psychiatric: Mood and affect are normal. Speech and behavior are normal.  ____________________________________________   LABS (   ____________________________________________  EKG Sinus  Rhythm 89 bpm -Right bundle branch block and right axis -consider right ventricular hypertrophy  -consider congenital heart disease or pulmonary disease.   ABNORMAL   ____________________________________________   ____________________________________________   INITIAL IMPRESSION / ASSESSMENT AND PLAN / ED COURSE  As part of my medical decision making, I reviewed the following data within the electronic MEDICAL RECORD NUMBER       Right bundle branch block with right carotid bruit.  Advised patient to have  evaluation by cardiologist.  Patient  states he will contact his family cardiologist.      ____________________________________________   FINAL CLINICAL IMPRESSION(S) / ED DIAGNOSES  @[facility] @   ED Discharge Orders     None        Note:  This document was prepared using Dragon voice recognition software and may include unintentional dictation errors.

## 2021-08-04 NOTE — Addendum Note (Signed)
Addended by: Aliene Altes on: 08/04/2021 03:44 PM   Modules accepted: Orders

## 2021-08-28 ENCOUNTER — Other Ambulatory Visit: Payer: Self-pay

## 2021-08-28 ENCOUNTER — Ambulatory Visit: Payer: Self-pay

## 2021-08-28 DIAGNOSIS — Z0110 Encounter for examination of ears and hearing without abnormal findings: Secondary | ICD-10-CM

## 2021-08-28 NOTE — Progress Notes (Signed)
Pt presents today for recheck hearing screen, performed by Charm Barges. ?

## 2021-08-28 NOTE — Progress Notes (Signed)
Hearing Screen 08/04/2021 - possible STS in left ear ? ?Presents today for 30 day hearing retest. ?No STS present in left ear in today's test ? ?AMD ?

## 2021-09-09 ENCOUNTER — Encounter: Payer: Self-pay | Admitting: Cardiovascular Disease

## 2021-09-09 ENCOUNTER — Ambulatory Visit (INDEPENDENT_AMBULATORY_CARE_PROVIDER_SITE_OTHER): Payer: 59 | Admitting: Cardiovascular Disease

## 2021-09-09 ENCOUNTER — Other Ambulatory Visit: Payer: Self-pay

## 2021-09-09 VITALS — BP 140/84 | HR 81 | Ht 69.0 in | Wt 142.2 lb

## 2021-09-09 DIAGNOSIS — E782 Mixed hyperlipidemia: Secondary | ICD-10-CM

## 2021-09-09 DIAGNOSIS — R0989 Other specified symptoms and signs involving the circulatory and respiratory systems: Secondary | ICD-10-CM

## 2021-09-09 DIAGNOSIS — I4510 Unspecified right bundle-branch block: Secondary | ICD-10-CM | POA: Diagnosis not present

## 2021-09-09 NOTE — Patient Instructions (Addendum)
Medication Instructions:  ?No changes ? ?If you need a refill on your cardiac medications before your next appointment, please call your pharmacy.  ? ?Lab work: ?No new labs needed ? ?Testing/Procedures: ?Your physician has requested that you have a carotid duplex. This test is an ultrasound of the carotid arteries in your neck. It looks at blood flow through these arteries that supply the brain with blood. Allow one hour for this exam. There are no restrictions or special instructions. ? ?Follow-Up: ?At [facility], you and your health needs are our priority.  As part of our continuing mission to provide you with exceptional heart care, we have created designated Provider Care Teams.  These Care Teams include your primary Cardiologist (physician) and Advanced Practice Providers (APPs -  Physician Assistants and Nurse Practitioners) who all work together to provide you with the care you need, when you need it. ? ?You will need a follow up appointment as needed ? ?Providers on your designated Care Team:   ?Nicolasa Ducking, NP ?Eula Listen, PA-C ?Cadence Fransico Michael, PA-C ? ?COVID-19 Vaccine Information can be found at: [email or web address] For questions related to vaccine distribution or appointments, please email [email or web address] or call [phone].  ? ?

## 2021-09-09 NOTE — Progress Notes (Signed)
Cardiology Office Note ? ?Date:  09/09/2021  ? ?ID:  Ryan Ross, DOB Jun 08, 1991, MRN [id number] ? ?PCP:  [facility]  ? ?Chief Complaint  ?Patient presents with  ? New Patient (Initial Visit)  ?  Ref by [facility] [place] for abnormal EKG; RBBB. Medications reviewed by the patient verbally.   ? ? ?HPI:  ?Ryan Ross is a 31 year old gentleman with past medical history of ?Asthma ?Who presents by referral from Nona Dell for consultation of his carotid bruit, abnormal EKG/right bundle branch block ? 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 ?No significant chest pain or shortness of breath on exertion, no limitations ?Was seen by primary care, noted to have right bundle branch block on EKG ?Also noted to have Right carotid bruit ? ?Good exercise tolerance at baseline ?Reports cholesterol creeping up slowly around 200 per his recollection ?Typically blood pressure has not been an issue, no diabetes ?No smoking, may use other tobacco products ? ?EKG personally reviewed by myself on todays visit ?Normal sinus rhythm rate 81 bpm right bundle branch block otherwise no significant ST-T wave changes ? ?Reports family history coronary disease ?GF age 37 ? ? ?PMH:   has a past medical history of Asthma. ? ?PSH:    ?Past Surgical History:  ?Procedure Laterality Date  ? WISDOM TOOTH EXTRACTION    ? 3 removed  ? ? ?No current outpatient medications on file.  ? ?No current facility-administered medications for this visit.  ? ? ? ?Allergies:   Patient has no known allergies.  ? ?Social History:  The patient  reports that he quit smoking about 9 years ago. His smoking use included cigarettes. His smokeless tobacco use includes snuff. He reports current alcohol use. He reports that he does not use drugs.  ? ?Family History:   family history includes Dementia in his maternal grandmother; Heart attack in his maternal grandfather.  ? ? ?Review of  Systems: ?Review of Systems  ?Constitutional: Negative.   ?HENT: Negative.    ?Respiratory: Negative.    ?Cardiovascular: Negative.   ?Gastrointestinal: Negative.   ?Musculoskeletal: Negative.   ?Neurological: Negative.   ?Psychiatric/Behavioral: Negative.    ?All other systems reviewed and are negative. ? ? ?PHYSICAL EXAM: ?VS:  BP 140/84 (BP Location: Right Arm, Patient Position: Sitting, Cuff Size: Normal)   Pulse 81   Ht 5\' 9"  (1.753 m)   Wt 142 lb 4 oz (64.5 kg)   SpO2 98%   BMI 21.01 kg/m?  , BMI Body mass index is 21.01 kg/m?. ?GEN: Well nourished, well developed, in no acute distress ?HEENT: normal ?Neck: no JVD, carotid bruits, or masses ?Cardiac: RRR; no murmurs, rubs, or gallops,no edema  ?Respiratory:  clear to auscultation bilaterally, normal work of breathing ?GI: soft, nontender, nondistended, + BS ?MS: no deformity or atrophy ?Skin: warm and dry, no rash ?Neuro:  Strength and sensation are intact ?Psych: euthymic mood, full affect ? ? ?Recent Labs: ?07/29/2021: ALT 11; BUN 16; Creatinine, Ser 1.04; Hemoglobin 16.9; Platelets 270; Potassium 4.1; Sodium 141; TSH 0.623  ? ? ?Lipid Panel ?Lab Results  ?Component Value Date  ? CHOL 200 (H) 07/29/2021  ? HDL 57 07/29/2021  ? LDLCALC 128 (H) 07/29/2021  ? TRIG 85 07/29/2021  ? ?  ? ?Wt Readings from Last 3 Encounters:  ?09/09/21 142 lb 4 oz (64.5 kg)  ?08/04/21 138 lb (62.6 kg)  ?05/05/20 139 lb (63 kg)  ?  ? ? ? ?  ASSESSMENT AND PLAN: ? ?Problem List Items Addressed This Visit   ?None ?Visit Diagnoses   ? ? Bruit of right carotid artery    -  Primary  ? Relevant Orders  ? EKG 12-Lead  ? VAS US CAROTID  ? Right bundle branch block      ? Mixed hyperlipidemia      ? ?  ? ?Right bundle branch block ?Benign finding on EKG ?Otherwise normal EKG, normal clinical exam ?No further cardiac work-up needed at this time ?No cardiac restrictions ? ?Carotid bruit ?Although short smoking history, high cholesterol, he is a young age, low risk for underlying PAD ?That  said we have ordered a carotid ultrasound for further evaluation ?High suspicion for torturous vessel.  If that is the case, no further work-up would be needed and no restrictions ? ?Hyperlipidemia ?Discussed screening strategies such as CT coronary calcium scoring ?No urgency to have this performed if carotid ultrasound confirms no significant disease ?Could consider at a later date, age 54 for example ? ? ? Total encounter time more than 45 minutes ? Greater than 50% was spent in counseling and coordination of care with the patient ? ? ? ?Signed, ?Dossie Arbour, M.D., Ph.D. ?[facility] [place], [place] ?[phone] ?

## 2021-09-24 ENCOUNTER — Ambulatory Visit (INDEPENDENT_AMBULATORY_CARE_PROVIDER_SITE_OTHER): Payer: 59

## 2021-09-24 DIAGNOSIS — R0989 Other specified symptoms and signs involving the circulatory and respiratory systems: Secondary | ICD-10-CM | POA: Diagnosis not present

## 2021-09-28 ENCOUNTER — Telehealth: Payer: Self-pay | Admitting: Emergency Medicine

## 2021-09-28 NOTE — Telephone Encounter (Signed)
-----   Message from Antonieta Iba, MD sent at 09/27/2021  1:36 PM EDT ----- ?Carotid ultrasound ?No significant stenosis bilaterally ?

## 2021-09-28 NOTE — Telephone Encounter (Signed)
Called and spoke with patient. Results reviewed with patient, pt verbalized understanding,  questions (if any) answered.   ?

## 2022-04-23 ENCOUNTER — Telehealth: Payer: Self-pay

## 2022-04-23 NOTE — Telephone Encounter (Signed)
Spoke with pt about scheduling his annual physical. Pt is not available at the time to schedule this and will call back my Monday.

## 2022-05-06 ENCOUNTER — Ambulatory Visit: Payer: Self-pay

## 2022-05-06 DIAGNOSIS — Z0289 Encounter for other administrative examinations: Secondary | ICD-10-CM

## 2022-05-06 LAB — POCT URINALYSIS DIPSTICK
Bilirubin, UA: NEGATIVE
Blood, UA: NEGATIVE
Glucose, UA: NEGATIVE
Ketones, UA: NEGATIVE
Leukocytes, UA: NEGATIVE
Nitrite, UA: NEGATIVE
Protein, UA: NEGATIVE
Spec Grav, UA: 1.02 (ref 1.010–1.025)
Urobilinogen, UA: 0.2 E.U./dL
pH, UA: 6 (ref 5.0–8.0)

## 2022-05-06 NOTE — Progress Notes (Signed)
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 Pt scheduled to complete physical.

## 2022-05-07 LAB — CMP12+LP+TP+TSH+6AC+CBC/D/PLT
ALT: 10 IU/L (ref 0–44)
AST: 14 IU/L (ref 0–40)
Albumin/Globulin Ratio: 2.2 (ref 1.2–2.2)
Albumin: 5 g/dL (ref 4.3–5.2)
Alkaline Phosphatase: 57 IU/L (ref 44–121)
BUN/Creatinine Ratio: 14 (ref 9–20)
BUN: 14 mg/dL (ref 6–20)
Basophils Absolute: 0.1 10*3/uL (ref 0.0–0.2)
Basos: 1 %
Bilirubin Total: 0.3 mg/dL (ref 0.0–1.2)
Calcium: 9.5 mg/dL (ref 8.7–10.2)
Chloride: 102 mmol/L (ref 96–106)
Chol/HDL Ratio: 3 ratio (ref 0.0–5.0)
Cholesterol, Total: 164 mg/dL (ref 100–199)
Creatinine, Ser: 0.99 mg/dL (ref 0.76–1.27)
EOS (ABSOLUTE): 0.2 10*3/uL (ref 0.0–0.4)
Eos: 3 %
Estimated CHD Risk: <0.5 times avg. (ref 0.0–1.0)
Free Thyroxine Index: 1.8 (ref 1.2–4.9)
GGT: 12 IU/L (ref 0–65)
Globulin, Total: 2.3 g/dL (ref 1.5–4.5)
Glucose: 84 mg/dL (ref 70–99)
HDL: 55 mg/dL (ref >39)
Hematocrit: 47.9 % (ref 37.5–51.0)
Hemoglobin: 16.4 g/dL (ref 13.0–17.7)
Immature Grans (Abs): 0 10*3/uL (ref 0.0–0.1)
Immature Granulocytes: 0 %
Iron: 93 ug/dL (ref 38–169)
LDH: 145 IU/L (ref 121–224)
LDL Chol Calc (NIH): 95 mg/dL (ref 0–99)
Lymphocytes Absolute: 1.6 10*3/uL (ref 0.7–3.1)
Lymphs: 27 %
MCH: 29.4 pg (ref 26.6–33.0)
MCHC: 34.2 g/dL (ref 31.5–35.7)
MCV: 86 fL (ref 79–97)
Monocytes Absolute: 0.4 10*3/uL (ref 0.1–0.9)
Monocytes: 6 %
Neutrophils Absolute: 3.7 10*3/uL (ref 1.4–7.0)
Neutrophils: 63 %
Phosphorus: 3.3 mg/dL (ref 2.8–4.1)
Platelets: 268 10*3/uL (ref 150–450)
Potassium: 4 mmol/L (ref 3.5–5.2)
RBC: 5.57 x10E6/uL (ref 4.14–5.80)
RDW: 12.2 % (ref 11.6–15.4)
Sodium: 140 mmol/L (ref 134–144)
T3 Uptake Ratio: 28 % (ref 24–39)
T4, Total: 6.4 ug/dL (ref 4.5–12.0)
TSH: 0.556 u[IU]/mL (ref 0.450–4.500)
Total Protein: 7.3 g/dL (ref 6.0–8.5)
Triglycerides: 76 mg/dL (ref 0–149)
Uric Acid: 5.3 mg/dL (ref 3.8–8.4)
VLDL Cholesterol Cal: 14 mg/dL (ref 5–40)
WBC: 6 10*3/uL (ref 3.4–10.8)
eGFR: 106 mL/min/{1.73_m2} (ref >59)

## 2022-06-24 ENCOUNTER — Ambulatory Visit: Payer: 59 | Admitting: Physician Assistant

## 2022-07-27 ENCOUNTER — Other Ambulatory Visit: Payer: Self-pay

## 2022-07-27 NOTE — Progress Notes (Signed)
Pt cleared ETOH. UDS pending with [facility]CL,RMA

## 2022-08-10 DIAGNOSIS — Q120 Congenital cataract: Secondary | ICD-10-CM | POA: Diagnosis not present

## 2022-08-10 DIAGNOSIS — H52223 Regular astigmatism, bilateral: Secondary | ICD-10-CM | POA: Diagnosis not present

## 2022-11-02 ENCOUNTER — Encounter: Payer: Self-pay | Admitting: Physician Assistant

## 2022-11-02 ENCOUNTER — Ambulatory Visit: Payer: Self-pay | Admitting: Physician Assistant

## 2022-11-02 VITALS — BP 125/91 | HR 84 | Temp 97.7°F | Resp 14 | Ht 68.0 in | Wt 144.0 lb

## 2022-11-02 DIAGNOSIS — Z0000 Encounter for general adult medical examination without abnormal findings: Secondary | ICD-10-CM

## 2022-11-02 NOTE — Progress Notes (Signed)
[facility]  ____________________________________________   None    (approximate)  I have reviewed the triage vital signs and the nursing notes.   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 male patient is known to have a right bundle branch block and a bruit in the right coronary artery on last exam.  Patient was evaluated by cardiology and said no further cardiac workup is needed at this time.         Past Medical History:  Diagnosis Date   Asthma     Patient Active Problem List   Diagnosis Date Noted   Seasonal allergies 06/11/2019   Situational anxiety 06/11/2019   Primary hypercoagulable state (HCC) 04/05/2017    Past Surgical History:  Procedure Laterality Date   WISDOM TOOTH EXTRACTION     3 removed    Prior to Admission medications   Not on File    Allergies Patient has no known allergies.  Family History  Problem Relation Age of Onset   Dementia Maternal Grandmother    Heart attack Maternal Grandfather     Social History Social History   Tobacco Use   Smoking status: Former    Years: 2    Types: Cigarettes    Quit date: 05/22/2012    Years since quitting: 10.4   Smokeless tobacco: Current    Types: Snuff  Vaping Use   Vaping Use: Never used  Substance Use Topics   Alcohol use: Yes    Comment: "social"   Drug use: No    Review of Systems Constitutional: No fever/chills Eyes: No visual changes. ENT: No sore throat. Cardiovascular: Denies chest pain. Respiratory: Denies shortness of breath. Gastrointestinal: No abdominal pain.  No nausea, no vomiting.  No diarrhea.  No constipation. Genitourinary: Negative for dysuria. Musculoskeletal: Negative for back pain. Skin: Negative for rash. Neurological: Negative for headaches, focal weakness or numbness.  ____________________________________________   PHYSICAL EXAM:  VITAL  SIGNS: BP 125/91  BP Location Left Arm  Patient Position Sitting  Cuff Size Normal  Pulse 84  Resp 14  Temp 97.7 F (36.5 C)  Temp src Temporal  SpO2 98 %  Weight 144 lb (65.3 kg)  Height 5\' 8"  (1.727 m)   BMI 21.90 kg/m2  BSA 1.77 m2  Constitutional: Alert and oriented. Well appearing and in no acute distress. Eyes: Conjunctivae are normal. PERRL. EOMI. Head: Atraumatic. Nose: No congestion/rhinnorhea. Mouth/Throat: Mucous membranes are moist.  Oropharynx non-erythematous. Neck: No stridor.  No cervical spine tenderness to palpation. Hematological/Lymphatic/Immunilogical: No cervical lymphadenopathy. Cardiovascular: Normal rate, regular rhythm. Grossly normal heart sounds.  Good peripheral circulation. Respiratory: Normal respiratory effort.  No retractions. Lungs CTAB. Gastrointestinal: Soft and nontender. No distention. No abdominal bruits. No CVA tenderness. Genitourinary: Deferred Musculoskeletal: No lower extremity tenderness nor edema.  No joint effusions. Neurologic:  Normal speech and language. No gross focal neurologic deficits are appreciated. No gait instability. Skin:  Skin is warm, dry and intact. No rash noted. Psychiatric: Mood and affect are normal. Speech and behavior are normal.  ____________________________________________   LABS _       Component Ref Range & Units 6 mo ago 1 yr ago 2 yr ago 3 yr ago  Color, UA yellow Yellow yellow Light Yellow  Clarity, UA clear Clear clear Clear  Glucose, UA Negative Negative Negative Negative Negative  Bilirubin, UA Negative Negative negative Negative  Ketones, UA Negative Negative negative Negative  Spec Grav, UA 1.010 - 1.025 1.020 1.015 1.025 1.020  Blood, UA negative Negative negative Positive CM  pH, UA 5.0 - 8.0 6.0 7.5 6.0 6.0  Protein, UA Negative Negative Negative Negative Negative  Urobilinogen, UA 0.2 or 1.0 E.U./dL 0.2 0.2 0.2 0.2  Nitrite, UA negative Negative negative Negative  Leukocytes,  UA Negative Negative Negative Negative Negative  Appearance  Clear medium   Odor  None                Component Ref Range & Units 6 mo ago (05/06/22) 1 yr ago (07/29/21) 2 yr ago (04/29/20) 3 yr ago (06/11/19) 5 yr ago (04/05/17) 5 yr ago (04/05/17)  Glucose 70 - 99 mg/dL 84 99 95 R CANCELED R, CM 108 High  R   Uric Acid 3.8 - 8.4 mg/dL 5.3 5.4 CM 5.8 CM 5.4 CM    Comment:            Therapeutic target for gout patients: <6.0  BUN 6 - 20 mg/dL 14 16 18 16 13    Creatinine, Ser 0.76 - 1.27 mg/dL 1.61 0.96 0.45 4.09 8.11 R   eGFR >59 mL/min/1.73 106 100      BUN/Creatinine Ratio 9 - 20 14 15 19 17     Sodium 134 - 144 mmol/L 140 141 141 141 139 R   Potassium 3.5 - 5.2 mmol/L 4.0 4.1 4.1 CANCELED R, CM 3.8 R   Chloride 96 - 106 mmol/L 102 102 103 103 103 R   Calcium 8.7 - 10.2 mg/dL 9.5 9.9 9.5 9.9 9.2 R   Phosphorus 2.8 - 4.1 mg/dL 3.3 3.5 3.8 CANCELED R, CM    Total Protein 6.0 - 8.5 g/dL 7.3 7.7 7.4 7.4 8.2 High  R   Albumin 4.3 - 5.2 g/dL 5.0 5.2 R 4.9 R 5.2 R 5.2 High  R   Globulin, Total 1.5 - 4.5 g/dL 2.3 2.5 2.5 2.2    Albumin/Globulin Ratio 1.2 - 2.2 2.2 2.1 2.0 2.4 High     Bilirubin Total 0.0 - 1.2 mg/dL 0.3 0.7 0.6 0.3 0.7 R   Alkaline Phosphatase 44 - 121 IU/L 57 64 57 CM 66 R 61 R   LDH 121 - 224 IU/L 145 152 163 301 High     AST 0 - 40 IU/L 14 14 21 20 21  R   ALT 0 - 44 IU/L 10 11 11 8 12  Low  R   GGT 0 - 65 IU/L 12 14 14 16     Iron 38 - 169 ug/dL 93 914 97 79    Cholesterol, Total 100 - 199 mg/dL 782 956 High  213 086    Triglycerides 0 - 149 mg/dL 76 85 61 578    HDL >46 mg/dL 55 57 56 54    VLDL Cholesterol Cal 5 - 40 mg/dL 14 15 12 20     LDL Chol Calc (NIH) 0 - 99 mg/dL 95 962 High  95 88    Chol/HDL Ratio 0.0 - 5.0 ratio 3.0 3.5 CM 2.9 CM 3.0 CM    Comment:                                   T. Chol/HDL Ratio  Men  Women                               1/2 Avg.Risk  3.4    3.3                                    Avg.Risk  5.0    4.4                                2X Avg.Risk  9.6    7.1                                3X Avg.Risk 23.4   11.0  Estimated CHD Risk 0.0 - 1.0 times avg.  < 0.5 0.6 CM  < 0.5 CM  < 0.5 CM    Comment: The CHD Risk is based on the T. Chol/HDL ratio. Other factors affect CHD Risk such as hypertension, smoking, diabetes, severe obesity, and family history of premature CHD.  TSH 0.450 - 4.500 uIU/mL 0.556 0.623 1.100 1.770    T4, Total 4.5 - 12.0 ug/dL 6.4 6.9 6.1 6.3    T3 Uptake Ratio 24 - 39 % 28 29 30 28     Free Thyroxine Index 1.2 - 4.9 1.8 2.0 1.8 1.8    WBC 3.4 - 10.8 x10E3/uL 6.0 7.1 6.1 6.8  6.5 R  RBC 4.14 - 5.80 x10E6/uL 5.57 5.94 High  5.54 5.76  5.51 R  Hemoglobin 13.0 - 17.7 g/dL 16.1 09.6 04.5 40.9  81.1 R  Hematocrit 37.5 - 51.0 % 47.9 50.3 47.2 49.0  46.7 R  MCV 79 - 97 fL 86 85 85 85  84.8 R  MCH 26.6 - 33.0 pg 29.4 28.5 29.6 29.5  29.5 R  MCHC 31.5 - 35.7 g/dL 91.4 78.2 95.6 21.3  08.6 R  RDW 11.6 - 15.4 % 12.2 12.2 12.1 12.2  12.7 R  Platelets 150 - 450 x10E3/uL 268 270 264 296  248 R  Neutrophils Not Estab. % 63 51 61 61  60 R  Lymphs Not Estab. % 27 33 27 27    Monocytes Not Estab. % 6 7 7 8     Eos Not Estab. % 3 7 4 3     Basos Not Estab. % 1 1 1 1     Neutrophils Absolute 1.4 - 7.0 x10E3/uL 3.7 3.6 3.7 4.2  3.8 R  Lymphocytes Absolute 0.7 - 3.1 x10E3/uL 1.6 2.4 1.7 1.9  1.8 R  Monocytes Absolute 0.1 - 0.9 x10E3/uL 0.4 0.5 0.4 0.5    EOS (ABSOLUTE) 0.0 - 0.4 x10E3/uL 0.2 0.5 High  0.2 0.2    Basophils Absolute 0.0 - 0.2 x10E3/uL 0.1 0.1 0.1 0.1  0.2 High  R  Immature Granulocytes Not Estab. % 0 1 0 0    Immature Grans (Abs)            ___________________________________________  EKG  Right bundle branch block.  70 bpm. ____________________________________________    ____________________________________________   INITIAL IMPRESSION / ASSESSMENT AND PLAN  As part of my medical decision  making, I reviewed the following data within the electronic MEDICAL RECORD NUMBER       No acute finding on physical exam and labs.  Asymptomatic right  bundle branch block.      ____________________________________________   FINAL CLINICAL IMPRESSION Well exam    ED Discharge Orders          Ordered    Hearing screening        11/02/22 1325    Spirometry with Graph        11/02/22 1325             Note:  This document was prepared using Dragon voice recognition software and may include unintentional dictation errors.

## 2023-07-29 ENCOUNTER — Ambulatory Visit: Payer: Self-pay

## 2023-07-29 DIAGNOSIS — Z0289 Encounter for other administrative examinations: Secondary | ICD-10-CM

## 2023-07-29 LAB — POCT URINALYSIS DIPSTICK
Bilirubin, UA: NEGATIVE
Blood, UA: NEGATIVE
Glucose, UA: NEGATIVE
Ketones, UA: NEGATIVE
Leukocytes, UA: NEGATIVE
Nitrite, UA: NEGATIVE
Protein, UA: NEGATIVE
Spec Grav, UA: 1.015 (ref 1.010–1.025)
Urobilinogen, UA: 0.2 U/dL
pH, UA: 6.5 (ref 5.0–8.0)

## 2023-07-31 LAB — CMP12+LP+TP+TSH+6AC+CBC/D/PLT
ALT: 9 [IU]/L (ref 0–44)
AST: 16 [IU]/L (ref 0–40)
Albumin: 4.6 g/dL (ref 4.3–5.2)
Alkaline Phosphatase: 60 [IU]/L (ref 44–121)
BUN/Creatinine Ratio: 14 (ref 9–20)
BUN: 13 mg/dL (ref 6–20)
Basophils Absolute: 0.1 10*3/uL (ref 0.0–0.2)
Basos: 1 %
Bilirubin Total: 0.5 mg/dL (ref 0.0–1.2)
Calcium: 10.1 mg/dL (ref 8.7–10.2)
Chloride: 102 mmol/L (ref 96–106)
Chol/HDL Ratio: 3.1 {ratio} (ref 0.0–5.0)
Cholesterol, Total: 161 mg/dL (ref 100–199)
Creatinine, Ser: 0.9 mg/dL (ref 0.76–1.27)
EOS (ABSOLUTE): 0.4 10*3/uL (ref 0.0–0.4)
Eos: 7 %
Estimated CHD Risk: <0.5 times avg. (ref 0.0–1.0)
Free Thyroxine Index: 2.1 (ref 1.2–4.9)
GGT: 14 [IU]/L (ref 0–65)
Globulin, Total: 2.5 g/dL (ref 1.5–4.5)
Glucose: 83 mg/dL (ref 70–99)
HDL: 52 mg/dL (ref >39)
Hematocrit: 47.7 % (ref 37.5–51.0)
Hemoglobin: 16.3 g/dL (ref 13.0–17.7)
Immature Grans (Abs): 0 10*3/uL (ref 0.0–0.1)
Immature Granulocytes: 0 %
Iron: 136 ug/dL (ref 38–169)
LDH: 149 [IU]/L (ref 121–224)
LDL Chol Calc (NIH): 92 mg/dL (ref 0–99)
Lymphocytes Absolute: 1.8 10*3/uL (ref 0.7–3.1)
Lymphs: 31 %
MCH: 29.6 pg (ref 26.6–33.0)
MCHC: 34.2 g/dL (ref 31.5–35.7)
MCV: 87 fL (ref 79–97)
Monocytes Absolute: 0.4 10*3/uL (ref 0.1–0.9)
Monocytes: 7 %
Neutrophils Absolute: 3.2 10*3/uL (ref 1.4–7.0)
Neutrophils: 54 %
Phosphorus: 2.9 mg/dL (ref 2.8–4.1)
Platelets: 291 10*3/uL (ref 150–450)
Potassium: 4 mmol/L (ref 3.5–5.2)
RBC: 5.51 x10E6/uL (ref 4.14–5.80)
RDW: 12.5 % (ref 11.6–15.4)
Sodium: 141 mmol/L (ref 134–144)
T3 Uptake Ratio: 30 % (ref 24–39)
T4, Total: 7.1 ug/dL (ref 4.5–12.0)
TSH: 0.979 u[IU]/mL (ref 0.450–4.500)
Total Protein: 7.1 g/dL (ref 6.0–8.5)
Triglycerides: 91 mg/dL (ref 0–149)
Uric Acid: 5.1 mg/dL (ref 3.8–8.4)
VLDL Cholesterol Cal: 17 mg/dL (ref 5–40)
WBC: 5.9 10*3/uL (ref 3.4–10.8)
eGFR: 118 mL/min/{1.73_m2} (ref >59)

## 2023-08-04 ENCOUNTER — Ambulatory Visit: Payer: Self-pay | Admitting: Physician Assistant

## 2023-08-04 ENCOUNTER — Encounter: Payer: Self-pay | Admitting: Physician Assistant

## 2023-08-04 VITALS — BP 120/90 | HR 78 | Temp 97.8°F | Resp 12 | Ht 68.0 in | Wt 144.0 lb

## 2023-08-04 DIAGNOSIS — Z0289 Encounter for other administrative examinations: Secondary | ICD-10-CM

## 2023-08-04 NOTE — Progress Notes (Signed)
[facility]   None    (approximate)  I have reviewed the triage vital signs and the nursing notes.   HISTORY  Chief Complaint No chief complaint on file.   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  Voices no concerns or complaints         Past Medical History:  Diagnosis Date   Asthma     Patient Active Problem List   Diagnosis Date Noted   Seasonal allergies 06/11/2019   Situational anxiety 06/11/2019   Primary hypercoagulable state (HCC) 04/05/2017    Past Surgical History:  Procedure Laterality Date   WISDOM TOOTH EXTRACTION     3 removed    Prior to Admission medications   Medication Sig Start Date End Date Taking? Authorizing Provider  SODIUM FLUORIDE 5000 PLUS 1.1 % CREA dental cream 1 Application 2 (two) times daily. 09/15/22   [provider]    Allergies Patient has no known allergies.  Family History  Problem Relation Age of Onset   Dementia Maternal Grandmother    Heart attack Maternal Grandfather     Social History Social History   Tobacco Use   Smoking status: Former    Current packs/day: 0.00    Types: Cigarettes    Start date: 05/22/2010    Quit date: 05/22/2012    Years since quitting: 11.2   Smokeless tobacco: Current    Types: Snuff  Vaping Use   Vaping status: Never Used  Substance Use Topics   Alcohol use: Yes    Comment: "social"   Drug use: No    Review of Systems Constitutional: No fever/chills Eyes: No visual changes. ENT: No sore throat. Cardiovascular: Denies chest pain. Respiratory: Denies shortness of breath.  Asthma. Gastrointestinal: No abdominal pain.  No nausea, no vomiting.  No diarrhea.  No constipation. Genitourinary: Negative for dysuria. Musculoskeletal: Negative for back pain. Skin: Negative for rash. Neurological: Negative for headaches, focal weakness or numbness. Psychiatric: Situational  anxiety. _____________________   PHYSICAL EXAM:  VITAL SIGNS: BP 120/90BP. 120/90. Data is abnormal. Taken on 08/04/23 9:52 AM  BP Location Left Arm  Patient Position Sitting  Cuff Size Normal  Pulse Rate 78  Temp 97.8 F (36.6 C)  Temp Source Temporal  Weight 144 lb (65.3 kg)  Height 5\' 8"  (1.727 m)  Resp 12  SpO2 99 %   BMI: 21.90 kg/m2  BSA: 1.77 m2   Constitutional: Alert and oriented. Well appearing and in no acute distress. Eyes: Conjunctivae are normal. PERRL. EOMI. Head: Atraumatic. Nose: No congestion/rhinnorhea. Mouth/Throat: Mucous membranes are moist.  Oropharynx non-erythematous. Neck: No stridor.  No cervical spine tenderness to palpation. Hematological/Lymphatic/Immunilogical: No cervical lymphadenopathy. Cardiovascular: Normal rate, regular rhythm. Grossly normal heart sounds.  Good peripheral circulation. Respiratory: Normal respiratory effort.  No retractions. Lungs CTAB. Gastrointestinal: Soft and nontender. No distention. No abdominal bruits. No CVA tenderness. Genitourinary: Deferred Musculoskeletal: No lower extremity tenderness nor edema.  No joint effusions. Neurologic:  Normal speech and language. No gross focal neurologic deficits are appreciated. No gait instability. Skin:  Skin is warm, dry and intact. No rash noted. Psychiatric: Mood and affect are normal. Speech and behavior are normal.  ____________________________________________   LABS ____________________________________________  EKG Sinus rhythm at 80 bpm.  Left atrial enlargement.  Right bundle branch block with right axis        Component Ref Range & Units (hover) 6 d ago 1 yr  ago 2 yr ago 3 yr ago 4 yr ago  Color, UA Yellow yellow Yellow yellow Light Yellow  Clarity, UA Clear clear Clear clear Clear  Glucose, UA Negative Negative Negative Negative Negative  Bilirubin, UA Negative Negative Negative negative Negative  Ketones, UA Negative Negative Negative negative Negative   Spec Grav, UA 1.015 1.020 1.015 1.025 1.020  Blood, UA Negative negative Negative negative Positive CM  pH, UA 6.5 6.0 7.5 6.0 6.0  Protein, UA Negative Negative Negative Negative Negative  Urobilinogen, UA 0.2 0.2 0.2 0.2 0.2  Nitrite, UA Negative negative Negative negative Negative  Leukocytes, UA Negative Negative Negative Negative Negative  Appearance   Clear medium   Odor   None               View All Conversations on this Encounter               Component Ref Range & Units (hover) 6 d ago (07/29/23) 1 yr ago (05/06/22) 2 yr ago (07/29/21) 3 yr ago (04/29/20) 4 yr ago (06/11/19) 6 yr ago (04/05/17) 6 yr ago (04/05/17)  Glucose 83 84 99 95 R CANCELED R, CM 108 High  R   Uric Acid 5.1 5.3 CM 5.4 CM 5.8 CM 5.4 CM    Comment:            Therapeutic target for gout patients: <6.0  BUN 13 14 16 18 16 13    Creatinine, Ser 0.90 0.99 1.04 0.97 0.93 0.80 R   eGFR 118 106 100      BUN/Creatinine Ratio 14 14 15 19 17     Sodium 141 140 141 141 141 139 R   Potassium 4.0 4.0 4.1 4.1 CANCELED R, CM 3.8 R   Chloride 102 102 102 103 103 103 R   Calcium 10.1 9.5 9.9 9.5 9.9 9.2 R   Phosphorus 2.9 3.3 3.5 3.8 CANCELED R, CM    Total Protein 7.1 7.3 7.7 7.4 7.4 8.2 High  R   Albumin 4.6 5.0 5.2 R 4.9 R 5.2 R 5.2 High  R   Globulin, Total 2.5 2.3 2.5 2.5 2.2    Bilirubin Total 0.5 0.3 0.7 0.6 0.3 0.7 R   Alkaline Phosphatase 60 57 64 57 CM 66 R 61 R   LDH 149 145 152 163 301 High     AST 16 14 14 21 20 21  R   ALT 9 10 11 11 8 12  Low  R   GGT 14 12 14 14 16     Iron 136 93 147 97 79    Cholesterol, Total 161 164 200 High  163 162    Triglycerides 91 76 85 61 114    HDL 52 55 57 56 54    VLDL Cholesterol Cal 17 14 15 12 20     LDL Chol Calc (NIH) 92 95 161 High  95 88    Chol/HDL Ratio 3.1 3.0 CM 3.5 CM 2.9 CM 3.0 CM    Comment:                                   T. Chol/HDL Ratio                                             Men  Women  1/2 Avg.Risk  3.4     3.3                                   Avg.Risk  5.0    4.4                                2X Avg.Risk  9.6    7.1                                3X Avg.Risk 23.4   11.0  Estimated CHD Risk  < 0.5  < 0.5 CM 0.6 CM  < 0.5 CM  < 0.5 CM    Comment: The CHD Risk is based on the T. Chol/HDL ratio. Other factors affect CHD Risk such as hypertension, smoking, diabetes, severe obesity, and family history of premature CHD.  TSH 0.979 0.556 0.623 1.100 1.770    T4, Total 7.1 6.4 6.9 6.1 6.3    T3 Uptake Ratio 30 28 29 30 28     Free Thyroxine Index 2.1 1.8 2.0 1.8 1.8    WBC 5.9 6.0 7.1 6.1 6.8  6.5 R  RBC 5.51 5.57 5.94 High  5.54 5.76  5.51 R  Hemoglobin 16.3 16.4 16.9 16.4 17.0  16.2 R  Hematocrit 47.7 47.9 50.3 47.2 49.0  46.7 R  MCV 87 86 85 85 85  84.8 R  MCH 29.6 29.4 28.5 29.6 29.5  29.5 R  MCHC 34.2 34.2 33.6 34.7 34.7  34.7 R  RDW 12.5 12.2 12.2 12.1 12.2  12.7 R  Platelets 291 268 270 264 296  248 R  Neutrophils 54 63 51 61 61  60 R  Lymphs 31 27 33 27 27    Monocytes 7 6 7 7 8     Eos 7 3 7 4 3     Basos 1 1 1 1 1     Neutrophils Absolute 3.2 3.7 3.6 3.7 4.2  3.8 R  Lymphocytes Absolute 1.8 1.6 2.4 1.7 1.9  1.8 R  Monocytes Absolute 0.4 0.4 0.5 0.4 0.5    EOS (ABSOLUTE) 0.4 0.2 0.5 High  0.2 0.2    Basophils Absolute 0.1 0.1 0.1 0.1 0.1  0.2 High  R  Immature Granulocytes 0 0 1 0 0    Immature Grans (Abs) 0.0 0.0 0.1 0.0 0.0                 ____________________________________________    ____________________________________________   INITIAL IMPRESSION / ASSESSMENT AND PLAN  As part of my medical decision making, I reviewed the following data within the electronic MEDICAL RECORD NUMBER       No acute findings on physical exam, EKG, or labs.     ____________________________________________   FINAL CLINICAL IMPRESSION  Well exam  ED Discharge Orders     None        Note:  This document was prepared using Dragon voice recognition software and may include  unintentional dictation errors.

## 2024-05-28 ENCOUNTER — Ambulatory Visit: Payer: Self-pay

## 2024-05-28 DIAGNOSIS — Z0289 Encounter for other administrative examinations: Secondary | ICD-10-CM

## 2024-05-28 LAB — POCT URINALYSIS DIPSTICK
Bilirubin, UA: NEGATIVE
Blood, UA: NEGATIVE
Glucose, UA: NEGATIVE
Ketones, UA: NEGATIVE
Leukocytes, UA: NEGATIVE
Nitrite, UA: NEGATIVE
Protein, UA: NEGATIVE
Spec Grav, UA: 1.015 (ref 1.010–1.025)
Urobilinogen, UA: 0.2 U/dL
pH, UA: 6 (ref 5.0–8.0)

## 2024-05-29 LAB — CMP12+LP+TP+TSH+6AC+CBC/D/PLT
ALT: 9 IU/L (ref 0–44)
AST: 14 IU/L (ref 0–40)
Albumin: 5.1 g/dL (ref 4.1–5.1)
Alkaline Phosphatase: 55 IU/L (ref 47–123)
BUN/Creatinine Ratio: 20 (ref 9–20)
BUN: 19 mg/dL (ref 6–20)
Basophils Absolute: 0.1 x10E3/uL (ref 0.0–0.2)
Basos: 1 %
Bilirubin Total: 0.4 mg/dL (ref 0.0–1.2)
Calcium: 9.4 mg/dL (ref 8.7–10.2)
Chloride: 101 mmol/L (ref 96–106)
Chol/HDL Ratio: 3.7 ratio (ref 0.0–5.0)
Cholesterol, Total: 187 mg/dL (ref 100–199)
Creatinine, Ser: 0.94 mg/dL (ref 0.76–1.27)
EOS (ABSOLUTE): 0.3 x10E3/uL (ref 0.0–0.4)
Eos: 5 %
Estimated CHD Risk: 0.6 times avg. (ref 0.0–1.0)
Free Thyroxine Index: 1.9 (ref 1.2–4.9)
GGT: 15 IU/L (ref 0–65)
Globulin, Total: 2.4 g/dL (ref 1.5–4.5)
Glucose: 89 mg/dL (ref 70–99)
HDL: 51 mg/dL (ref >39)
Hematocrit: 49 % (ref 37.5–51.0)
Hemoglobin: 16.7 g/dL (ref 13.0–17.7)
Immature Grans (Abs): 0 x10E3/uL (ref 0.0–0.1)
Immature Granulocytes: 0 %
Iron: 86 ug/dL (ref 38–169)
LDH: 150 IU/L (ref 121–224)
LDL Chol Calc (NIH): 118 mg/dL — ABNORMAL HIGH (ref 0–99)
Lymphocytes Absolute: 1.9 x10E3/uL (ref 0.7–3.1)
Lymphs: 33 %
MCH: 29.9 pg (ref 26.6–33.0)
MCHC: 34.1 g/dL (ref 31.5–35.7)
MCV: 88 fL (ref 79–97)
Monocytes Absolute: 0.4 x10E3/uL (ref 0.1–0.9)
Monocytes: 6 %
Neutrophils Absolute: 3.2 x10E3/uL (ref 1.4–7.0)
Neutrophils: 54 %
Phosphorus: 3.3 mg/dL (ref 2.8–4.1)
Platelets: 282 x10E3/uL (ref 150–450)
Potassium: 4 mmol/L (ref 3.5–5.2)
RBC: 5.58 x10E6/uL (ref 4.14–5.80)
RDW: 12.6 % (ref 11.6–15.4)
Sodium: 138 mmol/L (ref 134–144)
T3 Uptake Ratio: 30 % (ref 24–39)
T4, Total: 6.3 ug/dL (ref 4.5–12.0)
TSH: 0.845 u[IU]/mL (ref 0.450–4.500)
Total Protein: 7.5 g/dL (ref 6.0–8.5)
Triglycerides: 100 mg/dL (ref 0–149)
Uric Acid: 5 mg/dL (ref 3.8–8.4)
VLDL Cholesterol Cal: 18 mg/dL (ref 5–40)
WBC: 5.9 x10E3/uL (ref 3.4–10.8)
eGFR: 111 mL/min/1.73 (ref >59)

## 2024-06-05 ENCOUNTER — Encounter: Admitting: Physician Assistant

## 2024-07-30 ENCOUNTER — Encounter: Payer: Self-pay | Admitting: Physician Assistant
